# Patient Record
Sex: Female | Born: 1975 | Race: White | Hispanic: No | Marital: Married | Smoking: Current every day smoker
Health system: Southern US, Community
[De-identification: ages and names within clinical notes are randomized; demographics above are authoritative.]

## PROBLEM LIST (undated history)

## (undated) DIAGNOSIS — R7303 Prediabetes: Secondary | ICD-10-CM

## (undated) DIAGNOSIS — M1990 Unspecified osteoarthritis, unspecified site: Secondary | ICD-10-CM

## (undated) DIAGNOSIS — N6452 Nipple discharge: Secondary | ICD-10-CM

## (undated) DIAGNOSIS — G4730 Sleep apnea, unspecified: Secondary | ICD-10-CM

## (undated) HISTORY — PX: CYST REMOVAL NECK: SHX6281

## (undated) HISTORY — PX: OTHER SURGICAL HISTORY: SHX169

## (undated) HISTORY — PX: TUBAL LIGATION: SHX77

---

## 2001-04-08 ENCOUNTER — Ambulatory Visit (HOSPITAL_COMMUNITY): Admission: RE | Admit: 2001-04-08 | Discharge: 2001-04-08 | Payer: Self-pay | Admitting: Obstetrics and Gynecology

## 2001-04-11 ENCOUNTER — Ambulatory Visit (HOSPITAL_COMMUNITY): Admission: RE | Admit: 2001-04-11 | Discharge: 2001-04-11 | Payer: Self-pay | Admitting: Obstetrics and Gynecology

## 2001-04-11 ENCOUNTER — Encounter: Payer: Self-pay | Admitting: Obstetrics and Gynecology

## 2001-04-24 ENCOUNTER — Encounter: Payer: Self-pay | Admitting: Obstetrics and Gynecology

## 2001-04-24 ENCOUNTER — Ambulatory Visit (HOSPITAL_COMMUNITY): Admission: RE | Admit: 2001-04-24 | Discharge: 2001-04-24 | Payer: Self-pay | Admitting: Obstetrics and Gynecology

## 2012-03-12 ENCOUNTER — Other Ambulatory Visit (HOSPITAL_COMMUNITY)
Admission: RE | Admit: 2012-03-12 | Discharge: 2012-03-12 | Disposition: A | Discharge status: Home / Self Care | Payer: Self-pay | Source: Ambulatory Visit | Attending: Obstetrics and Gynecology | Admitting: Obstetrics and Gynecology

## 2012-03-12 DIAGNOSIS — Z01419 Encounter for gynecological examination (general) (routine) without abnormal findings: Secondary | ICD-10-CM | POA: Insufficient documentation

## 2012-10-25 DIAGNOSIS — R079 Chest pain, unspecified: Secondary | ICD-10-CM

## 2013-06-06 ENCOUNTER — Emergency Department (HOSPITAL_COMMUNITY)
Admission: EM | Admit: 2013-06-06 | Discharge: 2013-06-06 | Disposition: A | Discharge status: Home / Self Care | Payer: Self-pay | Attending: Emergency Medicine | Admitting: Emergency Medicine

## 2013-06-06 ENCOUNTER — Emergency Department (HOSPITAL_COMMUNITY): Payer: Self-pay

## 2013-06-06 ENCOUNTER — Encounter (HOSPITAL_COMMUNITY): Payer: Self-pay | Admitting: *Deleted

## 2013-06-06 DIAGNOSIS — M25561 Pain in right knee: Secondary | ICD-10-CM

## 2013-06-06 DIAGNOSIS — F17200 Nicotine dependence, unspecified, uncomplicated: Secondary | ICD-10-CM | POA: Insufficient documentation

## 2013-06-06 DIAGNOSIS — M25469 Effusion, unspecified knee: Secondary | ICD-10-CM | POA: Insufficient documentation

## 2013-06-06 DIAGNOSIS — M25569 Pain in unspecified knee: Secondary | ICD-10-CM | POA: Insufficient documentation

## 2013-06-06 DIAGNOSIS — Z880 Allergy status to penicillin: Secondary | ICD-10-CM | POA: Insufficient documentation

## 2013-06-06 MED ORDER — HYDROCODONE-ACETAMINOPHEN 5-325 MG PO TABS: 2.0000 | ORAL_TABLET | Freq: Four times a day (QID) | ORAL | Status: DC | PRN

## 2013-06-06 NOTE — ED Provider Notes (Signed)
History    CSN: [id number] Arrival date & time 06/06/13  1354  First MD Initiated Contact with Patient 06/06/13 1407     Chief Complaint  Patient presents with  . Knee Pain   (Consider location/radiation/quality/duration/timing/severity/associated sxs/prior Treatment) Patient is a 37 y.o. female presenting with knee pain.  Knee Pain  Pt reports about a week of progressively worsening R knee pain, moderate to severe aching pain, worse with movement and associated with a 'knot' behind knee. No known injury.   History reviewed. No pertinent past medical history. Past Surgical History  Procedure Laterality Date  . Cesarean section    . Cataract surgery Bilateral   . Cyst removal neck    . Tubal ligation     History reviewed. No pertinent family history. History  Substance Use Topics  . Smoking status: Current Every Day Smoker -- 1.00 packs/day    Types: Cigarettes  . Smokeless tobacco: Not on file  . Alcohol Use: No   OB History   Grav Para Term Preterm Abortions TAB SAB Ect Mult Living   3 3 3             Review of Systems All other systems reviewed and are negative except as noted in HPI.   Allergies  Penicillins  Home Medications   Current Outpatient Rx  Name  Route  Sig  Dispense  Refill  . ibuprofen (ADVIL,MOTRIN) 200 MG tablet   Oral   Take 200 mg by mouth every 6 (six) hours as needed for pain.          BP 132/64  Pulse 76  Temp(Src) 97.5 F (36.4 C) (Oral)  Resp 16  Ht 5\' 5"  (1.651 m)  Wt 295 lb (133.811 kg)  BMI 49.09 kg/m2  SpO2 100%  LMP 05/22/2013 Physical Exam  Nursing note and vitals reviewed. Constitutional: She is oriented to person, place, and time. She appears well-developed and well-nourished.  HENT:  Head: Normocephalic and atraumatic.  Eyes: EOM are normal. Pupils are equal, round, and reactive to light.  Neck: Normal range of motion. Neck supple.  Cardiovascular: Normal rate, normal heart sounds and intact distal pulses.    Pulmonary/Chest: Effort normal and breath sounds normal.  Abdominal: Bowel sounds are normal. She exhibits no distension. There is no tenderness.  Musculoskeletal: She exhibits edema (2+ bilateral LE edema is symmetric) and tenderness (tender to palpation R knee with a tender knot in popliteal fossa, decreased ROM due to pain).  Neurological: She is alert and oriented to person, place, and time. She has normal strength. No cranial nerve deficit or sensory deficit.  Skin: Skin is warm and dry. No rash noted.  Psychiatric: She has a normal mood and affect.    ED Course  Procedures (including critical care time) Labs Reviewed - No data to display US Venous Img Lower Unilateral Right  06/06/2013   *RADIOLOGY REPORT*  Clinical data:  Right lower extremity pain localizing to the the and popliteal fossa.  RIGHT LOWER EXTREMITY VENOUS DOPPLER ULTRASOUND  Technique:  Gray-scale sonography with compression, as well as color and duplex Doppler ultrasound, were performed to evaluate the deep venous system from the level of the common femoral vein through the popliteal and proximal calf veins. Evaluation also included physiologic response to augmentation.  Comparison: None.  Findings: All of the visualized deep veins in the right lower extremity demonstrate normal Doppler signal, normal compressibility, normal phasicity, and normal physiologic response to augmentation.  No gray scale filling defects were  identified. Note is made of mild subcutaneous edema overlying the right ankle joint.  IMPRESSION: No evidence of right lower extremity DVT.  No visible popliteal/Baker's cyst.   Original Report Authenticated By: Hulan Saas, M.D.   1. Knee pain, right     MDM  [place] neg for DVT or Baker's cyst. Atraumatic knee pain, no signs of infection or significant effusion. Suspect some degree of osteoarthritis, but no concern for acute fracture. Knee immobilizer, pain medications and Ortho referral.   Charles B.  Bernette Mayers, MD 06/06/13 1511

## 2013-06-06 NOTE — ED Notes (Signed)
R knee pain began a week ago which has progressively worsened.  Keeps awake at night d/t pain.  Can palpate knot behind right knee. Limits mobility.

## 2013-06-06 NOTE — ED Notes (Signed)
Right knee ace wrapped and patient given instructions.

## 2013-06-06 NOTE — ED Notes (Signed)
Unable to apply knee immobilizer due to patient size.

## 2013-06-06 NOTE — ED Notes (Addendum)
The patient states that she has been having posterior right knee pain x1 week, states that pain has gradually gotten worse since the beginning of the week.  The patient states that her right knee feels swollen to her.

## 2013-12-16 ENCOUNTER — Encounter (HOSPITAL_COMMUNITY): Payer: Self-pay | Admitting: Emergency Medicine

## 2013-12-16 ENCOUNTER — Emergency Department (HOSPITAL_COMMUNITY): Payer: No Typology Code available for payment source

## 2013-12-16 ENCOUNTER — Emergency Department (HOSPITAL_COMMUNITY)
Admission: EM | Admit: 2013-12-16 | Discharge: 2013-12-16 | Disposition: A | Discharge status: Home / Self Care | Payer: No Typology Code available for payment source | Attending: Emergency Medicine | Admitting: Emergency Medicine

## 2013-12-16 DIAGNOSIS — S8010XA Contusion of unspecified lower leg, initial encounter: Secondary | ICD-10-CM | POA: Insufficient documentation

## 2013-12-16 DIAGNOSIS — Z3202 Encounter for pregnancy test, result negative: Secondary | ICD-10-CM | POA: Insufficient documentation

## 2013-12-16 DIAGNOSIS — Z880 Allergy status to penicillin: Secondary | ICD-10-CM | POA: Insufficient documentation

## 2013-12-16 DIAGNOSIS — Y9389 Activity, other specified: Secondary | ICD-10-CM | POA: Insufficient documentation

## 2013-12-16 DIAGNOSIS — S199XXA Unspecified injury of neck, initial encounter: Secondary | ICD-10-CM

## 2013-12-16 DIAGNOSIS — S20219A Contusion of unspecified front wall of thorax, initial encounter: Secondary | ICD-10-CM | POA: Insufficient documentation

## 2013-12-16 DIAGNOSIS — S46911A Strain of unspecified muscle, fascia and tendon at shoulder and upper arm level, right arm, initial encounter: Secondary | ICD-10-CM

## 2013-12-16 DIAGNOSIS — Y92410 Unspecified street and highway as the place of occurrence of the external cause: Secondary | ICD-10-CM | POA: Insufficient documentation

## 2013-12-16 DIAGNOSIS — IMO0002 Reserved for concepts with insufficient information to code with codable children: Secondary | ICD-10-CM | POA: Insufficient documentation

## 2013-12-16 DIAGNOSIS — S8011XA Contusion of right lower leg, initial encounter: Secondary | ICD-10-CM

## 2013-12-16 DIAGNOSIS — S0993XA Unspecified injury of face, initial encounter: Secondary | ICD-10-CM | POA: Insufficient documentation

## 2013-12-16 DIAGNOSIS — F17200 Nicotine dependence, unspecified, uncomplicated: Secondary | ICD-10-CM | POA: Insufficient documentation

## 2013-12-16 LAB — PREGNANCY, URINE: Preg Test, Ur: NEGATIVE

## 2013-12-16 MED ORDER — OXYCODONE-ACETAMINOPHEN 5-325 MG PO TABS: 1.0000 | ORAL_TABLET | ORAL | Status: DC | PRN

## 2013-12-16 MED ORDER — OXYCODONE-ACETAMINOPHEN 5-325 MG PO TABS
2.0000 | ORAL_TABLET | Freq: Once | ORAL | Status: AC
  Administered 2013-12-16: 2 via ORAL
  Filled 2013-12-16: qty 2

## 2013-12-16 MED ORDER — OXYCODONE HCL 5 MG PO TABS
5.0000 mg | ORAL_TABLET | Freq: Once | ORAL | Status: AC
  Administered 2013-12-16: 5 mg via ORAL
  Filled 2013-12-16: qty 1

## 2013-12-16 MED ORDER — IBUPROFEN 400 MG PO TABS
600.0000 mg | ORAL_TABLET | Freq: Once | ORAL | Status: AC
  Administered 2013-12-16: 600 mg via ORAL
  Filled 2013-12-16: qty 2
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
## 2014-04-10 ENCOUNTER — Ambulatory Visit (HOSPITAL_COMMUNITY)
Admission: RE | Admit: 2014-04-10 | Discharge: 2014-04-10 | Disposition: A | Discharge status: Home / Self Care | Payer: BC Managed Care – PPO | Source: Ambulatory Visit | Attending: Dermatology | Admitting: Dermatology

## 2014-04-10 ENCOUNTER — Other Ambulatory Visit (HOSPITAL_COMMUNITY): Payer: Self-pay | Admitting: Internal Medicine

## 2014-04-10 ENCOUNTER — Other Ambulatory Visit (HOSPITAL_COMMUNITY): Payer: Self-pay | Admitting: Dermatology

## 2014-04-10 DIAGNOSIS — M795 Residual foreign body in soft tissue: Secondary | ICD-10-CM

## 2014-04-30 ENCOUNTER — Encounter (HOSPITAL_COMMUNITY)
Admission: RE | Admit: 2014-04-30 | Discharge: 2014-04-30 | Disposition: A | Discharge status: Home / Self Care | Payer: BC Managed Care – PPO | Source: Ambulatory Visit | Attending: General Surgery | Admitting: General Surgery

## 2014-04-30 ENCOUNTER — Encounter (HOSPITAL_COMMUNITY): Payer: Self-pay | Admitting: Pharmacy Technician

## 2014-04-30 ENCOUNTER — Encounter (HOSPITAL_COMMUNITY): Payer: Self-pay

## 2014-04-30 DIAGNOSIS — Z01812 Encounter for preprocedural laboratory examination: Secondary | ICD-10-CM | POA: Insufficient documentation

## 2014-04-30 DIAGNOSIS — Z01818 Encounter for other preprocedural examination: Secondary | ICD-10-CM | POA: Insufficient documentation

## 2014-04-30 LAB — CBC WITH DIFFERENTIAL/PLATELET
Basophils Absolute: 0.1 10*3/uL (ref 0.0–0.1)
Basophils Relative: 0 % (ref 0–1)
EOS ABS: 0.6 10*3/uL (ref 0.0–0.7)
Eosinophils Relative: 5 % (ref 0–5)
HCT: 39.6 % (ref 36.0–46.0)
Hemoglobin: 13.5 g/dL (ref 12.0–15.0)
LYMPHS ABS: 3.2 10*3/uL (ref 0.7–4.0)
Lymphocytes Relative: 26 % (ref 12–46)
MCH: 31.5 pg (ref 26.0–34.0)
MCHC: 34.1 g/dL (ref 30.0–36.0)
MCV: 92.5 fL (ref 78.0–100.0)
Monocytes Absolute: 0.7 10*3/uL (ref 0.1–1.0)
Monocytes Relative: 6 % (ref 3–12)
NEUTROS ABS: 7.5 10*3/uL (ref 1.7–7.7)
NEUTROS PCT: 63 % (ref 43–77)
PLATELETS: 310 10*3/uL (ref 150–400)
RBC: 4.28 MIL/uL (ref 3.87–5.11)
RDW: 13.4 % (ref 11.5–15.5)
WBC: 12 10*3/uL — AB (ref 4.0–10.5)

## 2014-04-30 LAB — BASIC METABOLIC PANEL
BUN: 10 mg/dL (ref 6–23)
CALCIUM: 8.9 mg/dL (ref 8.4–10.5)
CO2: 28 mEq/L (ref 19–32)
Chloride: 102 mEq/L (ref 96–112)
Creatinine, Ser: 0.73 mg/dL (ref 0.50–1.10)
GLUCOSE: 137 mg/dL — AB (ref 70–99)
POTASSIUM: 4.8 meq/L (ref 3.7–5.3)
Sodium: 141 mEq/L (ref 137–147)

## 2014-04-30 NOTE — Pre-Procedure Instructions (Signed)
Patient given information to sign up for my chart at home. 
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
## 2014-04-30 NOTE — Patient Instructions (Signed)
     LANISA ISHLER  04/30/2014   Your procedure is scheduled on:   05/05/5014  [facility] to [facility] at  64  AM.  Call this number if you have problems the morning of surgery: [phone]   Remember:   Do not eat food or drink liquids after midnight.   Take these medicines the morning of surgery with A SIP OF WATER:  oxycodone   Do not wear jewelry, make-up or nail polish.  Do not wear lotions, powders, or perfumes.   Do not shave 48 hours prior to surgery. Men may shave face and neck.  Do not bring valuables to the hospital.  [facility] is not responsible for any belongings or valuables.               Contacts, dentures or bridgework may not be worn into surgery.  Leave suitcase in the car. After surgery it may be brought to your room.  For patients admitted to the hospital, discharge time is determined by your treatment team.               Patients discharged the day of surgery will not be allowed to drive home.  Name and phone number of your driver: family  Special Instructions: Shower using CHG 2 nights before surgery and the night before surgery.  If you shower the day of surgery use CHG.  Use special wash - you have one bottle of CHG for all showers.  You should use approximately 1/3 of the bottle for each shower.   Please read over the following fact sheets that you were given: Pain Booklet, Coughing and Deep Breathing, Surgical Site Infection Prevention, Anesthesia Post-op Instructions and Care and Recovery After Surgery  Place foreign body patient instructions here. PATIENT INSTRUCTIONS POST-ANESTHESIA  IMMEDIATELY FOLLOWING SURGERY:  Do not drive or operate machinery for the first twenty four hours after surgery.  Do not make any important decisions for twenty four hours after surgery or while taking narcotic pain medications or sedatives.  If you develop intractable nausea and vomiting or a severe headache please notify your doctor immediately.  FOLLOW-UP:  Please  make an appointment with your surgeon as instructed. You do not need to follow up with anesthesia unless specifically instructed to do so.  WOUND CARE INSTRUCTIONS (if applicable):  Keep a dry clean dressing on the anesthesia/puncture wound site if there is drainage.  Once the wound has quit draining you may leave it open to air.  Generally you should leave the bandage intact for twenty four hours unless there is drainage.  If the epidural site drains for more than 36-48 hours please call the anesthesia department.  QUESTIONS?:  Please feel free to call your physician or the hospital operator if you have any questions, and they will be happy to assist you.

## 2014-05-05 ENCOUNTER — Ambulatory Visit (HOSPITAL_COMMUNITY): Payer: BC Managed Care – PPO | Admitting: Anesthesiology

## 2014-05-05 ENCOUNTER — Encounter (HOSPITAL_COMMUNITY): Payer: BC Managed Care – PPO | Admitting: Anesthesiology

## 2014-05-05 ENCOUNTER — Ambulatory Visit (HOSPITAL_COMMUNITY): Payer: BC Managed Care – PPO

## 2014-05-05 ENCOUNTER — Encounter (HOSPITAL_COMMUNITY)
Admission: RE | Disposition: A | Discharge status: Home / Self Care | Payer: Self-pay | Source: Ambulatory Visit | Attending: General Surgery

## 2014-05-05 ENCOUNTER — Encounter (HOSPITAL_COMMUNITY): Payer: Self-pay | Admitting: *Deleted

## 2014-05-05 ENCOUNTER — Ambulatory Visit (HOSPITAL_COMMUNITY)
Admission: RE | Admit: 2014-05-05 | Discharge: 2014-05-05 | Disposition: A | Discharge status: Home / Self Care | Payer: BC Managed Care – PPO | Source: Ambulatory Visit | Attending: General Surgery | Admitting: General Surgery

## 2014-05-05 DIAGNOSIS — S81009A Unspecified open wound, unspecified knee, initial encounter: Secondary | ICD-10-CM | POA: Insufficient documentation

## 2014-05-05 DIAGNOSIS — F17200 Nicotine dependence, unspecified, uncomplicated: Secondary | ICD-10-CM | POA: Insufficient documentation

## 2014-05-05 DIAGNOSIS — S81809A Unspecified open wound, unspecified lower leg, initial encounter: Principal | ICD-10-CM

## 2014-05-05 DIAGNOSIS — S91009A Unspecified open wound, unspecified ankle, initial encounter: Principal | ICD-10-CM

## 2014-05-05 DIAGNOSIS — Y929 Unspecified place or not applicable: Secondary | ICD-10-CM | POA: Insufficient documentation

## 2014-05-05 HISTORY — PX: FOREIGN BODY REMOVAL: SHX962

## 2014-05-05 SURGERY — FOREIGN BODY REMOVAL ADULT
Anesthesia: General | Site: Leg Lower | Laterality: Right

## 2014-05-05 MED ORDER — MIDAZOLAM HCL 2 MG/2ML IJ SOLN
INTRAMUSCULAR | Status: AC
  Filled 2014-05-05: qty 2

## 2014-05-05 MED ORDER — LACTATED RINGERS IV SOLN
INTRAVENOUS | Status: DC
  Administered 2014-05-05: 1000 mL via INTRAVENOUS

## 2014-05-05 MED ORDER — MIDAZOLAM HCL 2 MG/2ML IJ SOLN
1.0000 mg | INTRAMUSCULAR | Status: DC | PRN
  Administered 2014-05-05: 2 mg via INTRAVENOUS

## 2014-05-05 MED ORDER — FENTANYL CITRATE 0.05 MG/ML IJ SOLN
INTRAMUSCULAR | Status: DC | PRN
  Administered 2014-05-05: 25 ug via INTRAVENOUS
  Administered 2014-05-05: 50 ug via INTRAVENOUS
  Administered 2014-05-05: 25 ug via INTRAVENOUS
  Administered 2014-05-05 (×2): 50 ug via INTRAVENOUS

## 2014-05-05 MED ORDER — BUPIVACAINE HCL (PF) 0.5 % IJ SOLN
INTRAMUSCULAR | Status: AC
  Filled 2014-05-05: qty 30

## 2014-05-05 MED ORDER — PROPOFOL 10 MG/ML IV BOLUS
INTRAVENOUS | Status: DC | PRN
  Administered 2014-05-05: 175 mg via INTRAVENOUS

## 2014-05-05 MED ORDER — FENTANYL CITRATE 0.05 MG/ML IJ SOLN
INTRAMUSCULAR | Status: AC
  Filled 2014-05-05: qty 2

## 2014-05-05 MED ORDER — HYDROCODONE-ACETAMINOPHEN 5-325 MG PO TABS: 1.0000 | ORAL_TABLET | Freq: Four times a day (QID) | ORAL | Status: AC | PRN

## 2014-05-05 MED ORDER — ONDANSETRON HCL 4 MG/2ML IJ SOLN: 4.0000 mg | Freq: Once | INTRAMUSCULAR | Status: DC | PRN

## 2014-05-05 MED ORDER — POVIDONE-IODINE 10 % EX OINT
TOPICAL_OINTMENT | CUTANEOUS | Status: DC | PRN
  Administered 2014-05-05: 1 via TOPICAL

## 2014-05-05 MED ORDER — KETOROLAC TROMETHAMINE 30 MG/ML IJ SOLN
30.0000 mg | Freq: Once | INTRAMUSCULAR | Status: AC
  Administered 2014-05-05: 30 mg via INTRAVENOUS
  Filled 2014-05-05: qty 1

## 2014-05-05 MED ORDER — GLYCOPYRROLATE 0.2 MG/ML IJ SOLN
INTRAMUSCULAR | Status: AC
  Filled 2014-05-05: qty 1

## 2014-05-05 MED ORDER — LIDOCAINE HCL 1 % IJ SOLN
INTRAMUSCULAR | Status: DC | PRN
  Administered 2014-05-05: 40 mg via INTRADERMAL

## 2014-05-05 MED ORDER — FENTANYL CITRATE 0.05 MG/ML IJ SOLN
INTRAMUSCULAR | Status: AC
  Filled 2014-05-05: qty 5

## 2014-05-05 MED ORDER — ONDANSETRON HCL 4 MG/2ML IJ SOLN
INTRAMUSCULAR | Status: AC
  Filled 2014-05-05: qty 2

## 2014-05-05 MED ORDER — FENTANYL CITRATE 0.05 MG/ML IJ SOLN
25.0000 ug | INTRAMUSCULAR | Status: AC
  Administered 2014-05-05 (×2): 25 ug via INTRAVENOUS

## 2014-05-05 MED ORDER — GLYCOPYRROLATE 0.2 MG/ML IJ SOLN
0.2000 mg | Freq: Once | INTRAMUSCULAR | Status: AC
  Administered 2014-05-05: 0.2 mg via INTRAVENOUS

## 2014-05-05 MED ORDER — SULFAMETHOXAZOLE-TRIMETHOPRIM 400-80 MG PO TABS: 1.0000 | ORAL_TABLET | Freq: Two times a day (BID) | ORAL | Status: DC

## 2014-05-05 MED ORDER — 0.9 % SODIUM CHLORIDE (POUR BTL) OPTIME
TOPICAL | Status: DC | PRN
  Administered 2014-05-05: 1000 mL

## 2014-05-05 MED ORDER — PROPOFOL 10 MG/ML IV BOLUS
INTRAVENOUS | Status: AC
  Filled 2014-05-05: qty 20

## 2014-05-05 MED ORDER — CHLORHEXIDINE GLUCONATE 4 % EX LIQD: 1.0000 "application " | Freq: Once | CUTANEOUS | Status: DC

## 2014-05-05 MED ORDER — FENTANYL CITRATE 0.05 MG/ML IJ SOLN
25.0000 ug | INTRAMUSCULAR | Status: DC | PRN
  Administered 2014-05-05: 50 ug via INTRAVENOUS

## 2014-05-05 MED ORDER — VANCOMYCIN HCL 10 G IV SOLR
1500.0000 mg | INTRAVENOUS | Status: AC
  Administered 2014-05-05: 1500 mg via INTRAVENOUS
  Filled 2014-05-05: qty 1500

## 2014-05-05 MED ORDER — ONDANSETRON HCL 4 MG/2ML IJ SOLN
4.0000 mg | Freq: Once | INTRAMUSCULAR | Status: AC
  Administered 2014-05-05: 4 mg via INTRAVENOUS

## 2014-05-05 MED ORDER — POVIDONE-IODINE 10 % EX OINT
TOPICAL_OINTMENT | CUTANEOUS | Status: AC
  Filled 2014-05-05: qty 2

## 2014-05-05 SURGICAL SUPPLY — 31 items
BAG HAMPER (MISCELLANEOUS) ×2 IMPLANT
BANDAGE ELASTIC 6 VELCRO NS (GAUZE/BANDAGES/DRESSINGS) ×1 IMPLANT
BNDG COHESIVE 4X5 TAN STRL (GAUZE/BANDAGES/DRESSINGS) ×1 IMPLANT
CLOTH BEACON ORANGE TIMEOUT ST (SAFETY) ×2 IMPLANT
COVER LIGHT HANDLE STERIS (MISCELLANEOUS) ×4 IMPLANT
DRAPE C-ARM FOLDED MOBILE STRL (DRAPES) ×1 IMPLANT
ELECT NDL TIP 2.8 STRL (NEEDLE) ×1 IMPLANT
ELECT NEEDLE TIP 2.8 STRL (NEEDLE) ×2 IMPLANT
ELECT REM PT RETURN 9FT ADLT (ELECTROSURGICAL) ×2
ELECTRODE REM PT RTRN 9FT ADLT (ELECTROSURGICAL) ×1 IMPLANT
GLOVE BIOGEL PI IND STRL 7.0 (GLOVE) IMPLANT
GLOVE BIOGEL PI INDICATOR 7.0 (GLOVE) ×1
GLOVE EXAM NITRILE MD LF STRL (GLOVE) ×1 IMPLANT
GLOVE SS BIOGEL STRL SZ 6.5 (GLOVE) IMPLANT
GLOVE SUPERSENSE BIOGEL SZ 6.5 (GLOVE) ×1
GLOVE SURG SS PI 7.5 STRL IVOR (GLOVE) ×3 IMPLANT
GOWN STRL REUS W/TWL LRG LVL3 (GOWN DISPOSABLE) ×5 IMPLANT
KIT ROOM TURNOVER APOR (KITS) ×2 IMPLANT
MANIFOLD NEPTUNE II (INSTRUMENTS) ×2 IMPLANT
NS IRRIG 1000ML POUR BTL (IV SOLUTION) ×2 IMPLANT
PACK MINOR (CUSTOM PROCEDURE TRAY) ×2 IMPLANT
PAD ARMBOARD 7.5X6 YLW CONV (MISCELLANEOUS) ×2 IMPLANT
PADDING CAST ABS 6INX4YD NS (CAST SUPPLIES) ×1
PADDING CAST ABS COTTON 6X4 NS (CAST SUPPLIES) IMPLANT
SET BASIN LINEN APH (SET/KITS/TRAYS/PACK) ×2 IMPLANT
SOL PREP PROV IODINE SCRUB 4OZ (MISCELLANEOUS) ×2 IMPLANT
SPONGE GAUZE 4X4 12PLY (GAUZE/BANDAGES/DRESSINGS) ×1 IMPLANT
STOCKINETTE IMPERVIOUS LG (DRAPES) ×1 IMPLANT
STRIP CLOSURE SKIN 1/4X3 (GAUZE/BANDAGES/DRESSINGS) ×2 IMPLANT
SUT ETHILON 3 0 FSL (SUTURE) ×2 IMPLANT
SYR BULB IRRIGATION 50ML (SYRINGE) ×1 IMPLANT

## 2014-05-05 NOTE — Interval H&P Note (Signed)
History and Physical Interval Note:  05/05/2014 7:27 AM  Janice Gillespie  has presented today for surgery, with the diagnosis of foreign body right leg  The various methods of treatment have been discussed with the patient and family. After consideration of risks, benefits and other options for treatment, the patient has consented to  Procedure(s): FOREIGN BODY REMOVAL ADULT LEG (Right) as a surgical intervention .  The patient's history has been reviewed, patient examined, no change in status, stable for surgery.  I have reviewed the patient's chart and labs.  Questions were answered to the patient's satisfaction.     Aviva Signs A

## 2014-05-05 NOTE — Discharge Instructions (Signed)
Incision Care  An incision is a surgical cut to open your skin. You need to take care of your incision. This helps you to not get an infection. HOME CARE  Only take medicine as told by your doctor.  Do not take off your bandage (dressing) or get your incision wet until your doctor approves. Change the bandage and call your doctor if the bandage gets wet, dirty, or starts to smell.  Take showers. Do not take baths, swim, or do anything that may soak your incision until it heals.  Return to your normal diet and activities as told or allowed by your doctor.  Avoid lifting any weight until your doctor approves.  Put medicine that helps lessen itching on your incision as told by your doctor. Do not pick or scratch at your incision.  Keep your doctor visit to have your stitches (sutures) or staples removed.  Drink enough fluids to keep your pee (urine) clear or pale yellow. GET HELP RIGHT AWAY IF:  You have a fever.  You have a rash.  You are dizzy, or you pass out (faint) while standing.  You have trouble breathing.  You have a reaction or side effects to medicine given to you.  You have redness, puffiness (swelling), or more pain in the incision and medicine does not help.  You have fluid, blood, or yellowish-white fluid (pus) coming from the incision lasting over 1 day.  You have muscle aches, chills, or you feel sick.  You have a bad smell coming from the incision or bandage.  Your incision opens up after stitches, staples, or sticky strips have been removed.  You keep feeling sick to your stomach (nauseous) or keep throwing up (vomiting). MAKE SURE YOU:   Understand these instructions.  Will watch your condition.  Will get help right away if you are not doing well or get worse. Document Released: 01/30/2012 Document Reviewed: 01/30/2012 [facility] Patient Information 2014 [place].
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
## 2014-05-05 NOTE — Anesthesia Preprocedure Evaluation (Signed)
Anesthesia Evaluation  Patient identified by MRN, date of birth, ID band Patient awake    Reviewed: Allergy & Precautions, H&P , NPO status , Patient's Chart, lab work & pertinent test results  Airway Mallampati: II TM Distance: >3 FB Neck ROM: Full    Dental  (+) Teeth Intact   Pulmonary Current Smoker (am cough),  breath sounds clear to auscultation        Cardiovascular negative cardio ROS  Rhythm:Regular Rate:Normal     Neuro/Psych    GI/Hepatic negative GI ROS,   Endo/Other  Morbid obesity  Renal/GU      Musculoskeletal   Abdominal   Peds  Hematology   Anesthesia Other Findings   Reproductive/Obstetrics                           Anesthesia Physical Anesthesia Plan  ASA: II  Anesthesia Plan: General   Post-op Pain Management:    Induction: Intravenous  Airway Management Planned: LMA  Additional Equipment:   Intra-op Plan:   Post-operative Plan: Extubation in OR  Informed Consent: I have reviewed the patients History and Physical, chart, labs and discussed the procedure including the risks, benefits and alternatives for the proposed anesthesia with the patient or authorized representative who has indicated his/her understanding and acceptance.     Plan Discussed with:   Anesthesia Plan Comments:         Anesthesia Quick Evaluation

## 2014-05-05 NOTE — Transfer of Care (Signed)
Immediate Anesthesia Transfer of Care Note  Patient: Janice Gillespie  Procedure(s) Performed: Procedure(s): FOREIGN BODY REMOVAL ADULT LEG (Right)  Patient Location: PACU  Anesthesia Type:General  Level of Consciousness: awake  Airway & Oxygen Therapy: Patient Spontanous Breathing  Post-op Assessment: Report given to PACU RN  Post vital signs: Reviewed and stable  Complications: No apparent anesthesia complications

## 2014-05-05 NOTE — Anesthesia Procedure Notes (Addendum)
Procedure Name: LMA Insertion Date/Time: 05/05/2014 7:40 AM Performed by: Tressie Stalker E Pre-anesthesia Checklist: Patient identified, Patient being monitored, Emergency Drugs available, Timeout performed and Suction available Patient Re-evaluated:Patient Re-evaluated prior to inductionOxygen Delivery Method: Circle System Utilized Preoxygenation: Pre-oxygenation with 100% oxygen Intubation Type: IV induction Ventilation: Mask ventilation without difficulty LMA: LMA inserted LMA Size: 4.0 Number of attempts: 1 Placement Confirmation: positive ETCO2 and breath sounds checked- equal and bilateral

## 2014-05-05 NOTE — Anesthesia Postprocedure Evaluation (Signed)
  Anesthesia Post-op Note  Patient: Janice Gillespie  Procedure(s) Performed: Procedure(s): FOREIGN BODY REMOVAL ADULT LEG (Right)  Patient Location: PACU  Anesthesia Type:General  Level of Consciousness: awake, alert  and oriented  Airway and Oxygen Therapy: Patient Spontanous Breathing and Patient connected to face mask oxygen  Post-op Pain: mild  Post-op Assessment: Post-op Vital signs reviewed, Patient's Cardiovascular Status Stable, Respiratory Function Stable, Patent Airway and No signs of Nausea or vomiting  Post-op Vital Signs: Reviewed and stable  Last Vitals:  Filed Vitals:   05/05/14 0730  BP: 115/61  Pulse:   Temp:   Resp: 26    Complications: No apparent anesthesia complications

## 2014-05-06 ENCOUNTER — Encounter (HOSPITAL_COMMUNITY): Payer: Self-pay | Admitting: General Surgery

## 2014-09-22 ENCOUNTER — Encounter (HOSPITAL_COMMUNITY): Payer: Self-pay | Admitting: General Surgery

## 2014-10-09 ENCOUNTER — Other Ambulatory Visit: Payer: Self-pay | Admitting: Advanced Practice Midwife

## 2014-10-23 ENCOUNTER — Encounter: Payer: Self-pay | Admitting: Advanced Practice Midwife

## 2014-10-23 ENCOUNTER — Other Ambulatory Visit (HOSPITAL_COMMUNITY)
Admission: RE | Admit: 2014-10-23 | Discharge: 2014-10-23 | Disposition: A | Discharge status: Home / Self Care | Payer: 59 | Source: Ambulatory Visit | Attending: Advanced Practice Midwife | Admitting: Advanced Practice Midwife

## 2014-10-23 ENCOUNTER — Ambulatory Visit (INDEPENDENT_AMBULATORY_CARE_PROVIDER_SITE_OTHER): Payer: 59 | Admitting: Advanced Practice Midwife

## 2014-10-23 VITALS — BP 120/80 | Ht 66.0 in | Wt 310.0 lb

## 2014-10-23 DIAGNOSIS — Z1329 Encounter for screening for other suspected endocrine disorder: Secondary | ICD-10-CM

## 2014-10-23 DIAGNOSIS — Z1151 Encounter for screening for human papillomavirus (HPV): Secondary | ICD-10-CM | POA: Insufficient documentation

## 2014-10-23 DIAGNOSIS — Z6841 Body Mass Index (BMI) 40.0 and over, adult: Secondary | ICD-10-CM

## 2014-10-23 DIAGNOSIS — Z01419 Encounter for gynecological examination (general) (routine) without abnormal findings: Secondary | ICD-10-CM | POA: Insufficient documentation

## 2014-10-23 DIAGNOSIS — A5901 Trichomonal vulvovaginitis: Secondary | ICD-10-CM

## 2014-10-23 DIAGNOSIS — Z13220 Encounter for screening for lipoid disorders: Secondary | ICD-10-CM

## 2014-10-23 DIAGNOSIS — Z0189 Encounter for other specified special examinations: Secondary | ICD-10-CM

## 2014-10-23 LAB — CBC
HEMATOCRIT: 40 % (ref 36.0–46.0)
Hemoglobin: 13.8 g/dL (ref 12.0–15.0)
MCH: 30.9 pg (ref 26.0–34.0)
MCHC: 34.5 g/dL (ref 30.0–36.0)
MCV: 89.5 fL (ref 78.0–100.0)
MPV: 11.5 fL (ref 9.4–12.4)
Platelets: 315 10*3/uL (ref 150–400)
RBC: 4.47 MIL/uL (ref 3.87–5.11)
RDW: 13.5 % (ref 11.5–15.5)
WBC: 12.4 10*3/uL — AB (ref 4.0–10.5)

## 2014-10-23 LAB — COMPREHENSIVE METABOLIC PANEL
ALK PHOS: 96 U/L (ref 39–117)
ALT: 18 U/L (ref 0–35)
AST: 17 U/L (ref 0–37)
Albumin: 3.4 g/dL — ABNORMAL LOW (ref 3.5–5.2)
BUN: 14 mg/dL (ref 6–23)
CO2: 26 mEq/L (ref 19–32)
Calcium: 9.1 mg/dL (ref 8.4–10.5)
Chloride: 102 mEq/L (ref 96–112)
Creat: 0.66 mg/dL (ref 0.50–1.10)
GLUCOSE: 92 mg/dL (ref 70–99)
POTASSIUM: 4.5 meq/L (ref 3.5–5.3)
SODIUM: 138 meq/L (ref 135–145)
TOTAL PROTEIN: 6.5 g/dL (ref 6.0–8.3)
Total Bilirubin: 0.5 mg/dL (ref 0.2–1.2)

## 2014-10-23 LAB — LIPID PANEL
Cholesterol: 130 mg/dL (ref 0–200)
HDL: 39 mg/dL — ABNORMAL LOW
LDL Cholesterol: 72 mg/dL (ref 0–99)
Total CHOL/HDL Ratio: 3.3 ratio
Triglycerides: 96 mg/dL
VLDL: 19 mg/dL (ref 0–40)

## 2014-10-23 LAB — TSH: TSH: 1.057 u[IU]/mL (ref 0.350–4.500)

## 2014-10-23 NOTE — Progress Notes (Addendum)
Janice Gillespie 38 y.o.  Filed Vitals:   10/23/14 0913  BP: 120/80     Past Medical History: History reviewed. No pertinent past medical history.  Past Surgical History: Past Surgical History  Procedure Laterality Date  . Cesarean section    . Cataract surgery Bilateral   . Cyst removal neck    . Tubal ligation    . Foreign body removal Right 05/05/2014    Procedure: FOREIGN BODY REMOVAL ADULT LEG;  Surgeon: Jamesetta So, MD;  Location: [facility];  Service: General;  Laterality: Right;    Family History: Family History  Problem Relation Age of Onset  . Diabetes Father   . Heart disease Father   . Diabetes Maternal Grandmother   . Cancer Maternal Grandfather   . Mental illness Paternal Grandmother     dementia    Social History: History  Substance Use Topics  . Smoking status: Current Every Day Smoker -- 1.00 packs/day for 25 years    Types: Cigarettes  . Smokeless tobacco: Not on file  . Alcohol Use: No    Allergies:  Allergies  Allergen Reactions  . Doxycycline     Really bad hives  . Penicillins Hives     Current outpatient prescriptions: ibuprofen (ADVIL,MOTRIN) 200 MG tablet, Take 800 mg by mouth every 8 (eight) hours as needed for headache or moderate pain. , Disp: , Rfl: ;  HYDROcodone-acetaminophen (NORCO/VICODIN) 5-325 MG per tablet, Take 1-2 tablets by mouth every 6 (six) hours as needed for moderate pain. (Patient not taking: Reported on 10/23/2014), Disp: 40 tablet, Rfl: 0 sulfamethoxazole-trimethoprim (BACTRIM) 400-80 MG per tablet, Take 1 tablet by mouth 2 (two) times daily. (Patient not taking: Reported on 10/23/2014), Disp: 20 tablet, Rfl: 0  History of Present Illness: Here for Pap and physical.  Last pap 13 years ago (no insurance).  Had a BTL.  Denies health problems   Review of Systems   Patient denies any headaches, blurred vision, shortness of breath, chest pain, abdominal pain, problems with bowel movements, urination, or  intercourse.   Physical Exam: General:  Well developed, well nourished, no acute distress Skin:  Warm and dry Neck:  Midline trachea, normal thyroid Lungs; Clear to auscultation bilaterally Breast:  No dominant palpable mass, retraction, or nipple discharge Cardiovascular: Regular rate and rhythm Abdomen:  Soft, non tender, no hepatosplenomegaly Pelvic:  External genitalia is normal in appearance.  The vagina is normal in appearance.  The cervix is bulbous.  Uterus is felt to be normal size, shape, and contour. E  No adnexal masses or tenderness noted, but exam is limited by body habitus Extremities:  No swelling or varicosities noted.  Still has slow (nearly healed) wound from surgery in Janurary. Pt states she has been tested for DM, and the surgeon is aware that healing is slow.  Psych:  No mood changes.     Impression: normal GYN exam     Plan: pap/cotesting for HPV Lipids, CMP, CBC, and TSH screening labs  10/28/2014 Trichomonas on Pap. 2gm Flagyl for pt and partner sent to pharmacy.  POC 1 month

## 2014-10-24 LAB — CYTOLOGY - PAP

## 2014-10-28 DIAGNOSIS — A5901 Trichomonal vulvovaginitis: Secondary | ICD-10-CM | POA: Insufficient documentation

## 2014-10-28 MED ORDER — METRONIDAZOLE 500 MG PO TABS: 2000.0000 mg | ORAL_TABLET | Freq: Once | ORAL | Status: DC

## 2014-10-28 NOTE — Addendum Note (Signed)
Addended by: Christin Fudge on: 10/28/2014 09:21 AM   Modules accepted: Orders

## 2014-10-29 ENCOUNTER — Telehealth: Payer: Self-pay | Admitting: Advanced Practice Midwife

## 2014-10-30 ENCOUNTER — Telehealth: Payer: Self-pay | Admitting: Advanced Practice Midwife

## 2014-11-27 ENCOUNTER — Encounter: Payer: Self-pay | Admitting: Advanced Practice Midwife

## 2014-11-27 ENCOUNTER — Ambulatory Visit: Payer: 59 | Admitting: Advanced Practice Midwife

## 2017-03-08 DIAGNOSIS — L409 Psoriasis, unspecified: Secondary | ICD-10-CM | POA: Diagnosis not present

## 2017-03-08 DIAGNOSIS — M25512 Pain in left shoulder: Secondary | ICD-10-CM | POA: Diagnosis not present

## 2017-03-08 DIAGNOSIS — Z0000 Encounter for general adult medical examination without abnormal findings: Secondary | ICD-10-CM | POA: Diagnosis not present

## 2017-03-30 DIAGNOSIS — R7301 Impaired fasting glucose: Secondary | ICD-10-CM | POA: Diagnosis not present

## 2017-03-30 DIAGNOSIS — M25512 Pain in left shoulder: Secondary | ICD-10-CM | POA: Diagnosis not present

## 2017-03-30 DIAGNOSIS — D72829 Elevated white blood cell count, unspecified: Secondary | ICD-10-CM | POA: Diagnosis not present

## 2017-04-24 ENCOUNTER — Ambulatory Visit (INDEPENDENT_AMBULATORY_CARE_PROVIDER_SITE_OTHER): Payer: 59

## 2017-04-24 ENCOUNTER — Ambulatory Visit (INDEPENDENT_AMBULATORY_CARE_PROVIDER_SITE_OTHER): Payer: 59 | Admitting: Orthopedic Surgery

## 2017-04-24 ENCOUNTER — Encounter: Payer: Self-pay | Admitting: Orthopedic Surgery

## 2017-04-24 VITALS — BP 123/51 | HR 86 | Ht 65.5 in | Wt 311.0 lb

## 2017-04-24 DIAGNOSIS — M25561 Pain in right knee: Secondary | ICD-10-CM | POA: Diagnosis not present

## 2017-04-24 DIAGNOSIS — M23321 Other meniscus derangements, posterior horn of medial meniscus, right knee: Secondary | ICD-10-CM | POA: Diagnosis not present

## 2017-04-24 DIAGNOSIS — M1711 Unilateral primary osteoarthritis, right knee: Secondary | ICD-10-CM

## 2017-04-24 NOTE — Patient Instructions (Addendum)
Knee Arthroscopy Knee arthroscopy is a surgical procedure that is used to examine the inside of your knee joint and repair any damage. The surgeon puts a small, lighted instrument with a camera on the tip (arthroscope) through a small incision in your knee. The camera sends pictures to a monitor in the operating room. Your surgeon uses those pictures to guide the surgical instruments through other incisions to the area of damage. Knee arthroscopy can be used to treat many types of knee problems. It may be used:  To repair a torn ligament.  To repair or remove damaged tissue.  To remove a fluid-filled sac (cyst) from your knee.  Tell a health care provider about:  Any allergies you have.  All medicines you are taking, including vitamins, herbs, eye drops, creams, and over-the-counter medicines.  Any problems you or family members have had with anesthetic medicines.  Any blood disorders you have.  Any surgeries you have had.  Any medical conditions you have. What are the risks? Generally, this is a safe procedure. However, problems may occur, including:  Infection.  Bleeding.  Damage to blood vessels, nerves, or structures of your knee.  A blood clot that forms in your leg and travels to your lung.  Failure to relieve symptoms.  What happens before the procedure?  Ask your health care provider about: ? Changing or stopping your regular medicines. This is especially important if you are taking diabetes medicines or blood thinners. ? Taking medicines such as aspirin and ibuprofen. These medicines can thin your blood. Do not take these medicines before your procedure if your health care provider instructs you not to.  Follow your health care provider's instructions about eating or drinking restrictions.  Plan to have someone take you home after the procedure.  If you go home right after the procedure, plan to have someone with you for 24 hours.  Do not drink alcohol unless  your health care provider says that you can.  Do not use any tobacco products, including cigarettes, chewing tobacco, or electronic cigarettes unless your health care provider says that you can. If you need help quitting, ask your health care provider.  You may have a physical exam. What happens during the procedure?  An IV tube will be inserted into one of your veins.  You will be given one or more of the following: ? A medicine that helps you relax (sedative). ? A medicine that numbs the area (local anesthetic). ? A medicine that makes you fall asleep (general anesthetic). ? A medicine that is injected into your spine that numbs the area below and slightly above the injection site (spinal anesthetic). ? A medicine that is injected into an area of your body that numbs everything below the injection site (regional anesthetic).  A cuff may be placed around your upper leg to slow bleeding during the procedure.  The surgeon will make a small number of incisions around your knee.  Your knee joint will be flushed and filled with a germ-free (sterile) solution.  The arthroscope will be passed through an incision into your knee joint.  More instruments will be passed through other incisions to repair your knee as needed.  The fluid will be removed from your knee.  The incisions will be closed with adhesive strips or stitches (sutures).  A bandage (dressing) will be placed over your knee. The procedure may vary among health care providers and hospitals. What happens after the procedure?  Your blood pressure, heart rate, breathing  your body weight. You may have to wear compression stockings. These stocking help to prevent blood clots and reduce swelling in your legs. This information is not intended to replace advice given to you by your health care  provider. Make sure you discuss any questions you have with your health care provider. Document Released: 11/04/2000 Document Revised: 04/14/2016 Document Reviewed: 11/03/2014 Elsevier Interactive Patient Education  2017 [employer] Meniscus Tear A meniscus tear is a knee injury in which a piece of the meniscus is torn. The meniscus is a thick, rubbery, wedge-shaped cartilage in the knee. Two menisci are located in each knee. They sit between the upper bone (femur) and lower bone (tibia) that make up the knee joint. Each meniscus acts as a shock absorber for the knee. A torn meniscus is one of the most common types of knee injuries. This injury can range from mild to severe. Surgery may be needed for a severe tear. What are the causes? This injury may be caused by any squatting, twisting, or pivoting movement. Sports-related injuries are the most common cause. These often occur from:  Running and stopping suddenly.  Changing direction.  Being tackled or knocked off your feet.  As people get older, their meniscus gets thinner and weaker. In these people, tears can happen more easily, such as from climbing stairs. What increases the risk? This injury is more likely to happen to:  People who play contact sports.  Males.  People who are 30?43 years of age.  What are the signs or symptoms? Symptoms of this injury include:  Knee pain, especially at the side of the knee joint. You may feel pain when the injury occurs, or you may only hear a pop and feel pain later.  A feeling that your knee is clicking, catching, locking, or giving way.  Not being able to fully bend or extend your knee.  Bruising or swelling in your knee.  How is this diagnosed? This injury may be diagnosed based on your symptoms and a physical exam. The physical exam may include:  Moving your knee in different ways.  Feeling for tenderness.  Listening for a clicking sound.  Checking if your knee locks or  catches.  You may also have tests, such as:  X-rays.  MRI.  A procedure to look inside your knee with a narrow surgical telescope (arthroscopy).  You may be referred to a knee specialist (orthopedic surgeon). How is this treated? Treatment for this injury depends on the severity of the tear. Treatment for a mild tear may include:  Rest.  Medicine to reduce pain and swelling. This is usually a nonsteroidal anti-inflammatory drug (NSAID).  A knee brace or an elastic sleeve or wrap.  Using crutches or a walker to keep weight off your knee and to help you walk.  Exercises to strengthen your knee (physical therapy).  You may need surgery if you have a severe tear or if other treatments are not working. Follow these instructions at home: Managing pain and swelling  Take over-the-counter and prescription medicines only as told by your health care provider.  If directed, apply ice to the injured area: ? Put ice in a plastic bag. ? Place a towel between your skin and the bag. ? Leave the ice on for 20 minutes, 2-3 times per day.  Raise (elevate) the injured area above the level of your heart while you are sitting or lying down. Activity  Do not use the injured limb to support your body   Leave the ice on for 20 minutes, 2-3 times per day.  Raise (elevate) the injured area above the level of your heart while you are sitting or lying down. Activity  Do not use the injured limb to support your body weight until your health care provider says that you can. Use crutches or a walker as told by your health care provider.  Return to your normal activities as told by your health care provider. Ask your health care provider what activities are safe for you.  Perform range-of-motion exercises only as told by your health care provider.  Begin doing exercises to strengthen your knee and leg muscles only as told by your health care provider. After you recover, your health care provider may recommend these exercises to help prevent another injury. General instructions  Use a knee brace or elastic wrap as told by your health  care provider.  Keep all follow-up visits as told by your health care provider. This is important. Contact a health care provider if:  You have a fever.  Your knee becomes red, tender, or swollen.  Your pain medicine is not helping.  Your symptoms get worse or do not improve after 2 weeks of home care. This information is not intended to replace advice given to you by your health care provider. Make sure you discuss any questions you have with your health care provider. Document Released: 01/28/2003 Document Revised: 04/14/2016 Document Reviewed: 03/02/2015 Elsevier Interactive Patient Education  [employer].

## 2017-04-24 NOTE — Progress Notes (Signed)
Patient ID: Janice Gillespie, female   DOB: [birth year], 41 y.o.   MRN: [id number]  Chief Complaint  Patient presents with  . Knee Pain    Right knee pain, referred by Dr. Nevada Crane. No injury.    41 year old female presents with a 3 month history of new onset right knee pain with pain over the medial joint line and somewhat in the front of the knee and back of the knee with no history of trauma. She has been treated with diclofenac ice rest and Robaxin. She has not improved. She is having trouble walking in ending and straightening her right knee and it feels that this clinic about    Review of Systems  Constitutional: Negative for chills and fever.  Musculoskeletal: Negative for back pain.  Skin: Negative for rash.  Neurological: Negative for tingling.    The patient reports no medical history of hypertension diabetes or heart disease  Past Surgical History:  Procedure Laterality Date  . cataract surgery Bilateral   . CESAREAN SECTION    . CYST REMOVAL NECK    . FOREIGN BODY REMOVAL Right 05/05/2014   Procedure: FOREIGN BODY REMOVAL ADULT LEG;  Surgeon: Jamesetta So, MD;  Location: [facility];  Service: General;  Laterality: Right;  . TUBAL LIGATION      PHYSICAL EXAM  BP (!) 123/51   Pulse 86   Ht 5' 5.5" (1.664 m)   Wt (!) 311 lb (141.1 kg)   BMI 50.97 kg/m  GENERAL appearance reveals no gross abnormalities, normal development grooming and hygiene   MENTAL STATUS we note that the patient is awake alert and oriented to person place and time MOOD/AFFECT ARE NORMAL   GAIT reveals MODEST  limp in the effected limb  EXAM OF THE LEFT KNEE SKIN no erythema lacerations or ecchymosis  INSPECTION Moderate tenderness over the MEDIAL  joint line and MILD  joint effusion  ROM is 0-100 PASSIVE 115 PASSIVE  STABILITY tests for the acl (anterior drawer) and pcl posterior drawer are normal. the collateral ligaments are stable with varus valgus stress testing  MOTOR GRADE 5/5 in the  quad muscles  McMurray's test was positive for medial meniscal tear   examination of the  right knee   115 active and passive range of motion ligament stable  VASC 2+ dorsalis pedis pulse normal capillary refill excellent warmth to the extremity  NEURO normal sensation and no pathologic reflexes  LYMPH deferred noncontributory   IMAGING STUDIES  I have independently reviewed the x-rays and I interpreted the x-rays as follows:  Mild osteoarthritis right knee  Dx    Encounter Diagnoses  Name Primary?  . Acute pain of right knee   . Primary osteoarthritis of right knee   . Derangement of posterior horn of medial meniscus of right knee Yes   PLAN Continue ice NSAIDs MRI right knee evaluate for medial meniscus tear and possible surgery    9:43 AM Arther Abbott, MD 04/24/2017

## 2017-05-09 ENCOUNTER — Other Ambulatory Visit: Payer: Self-pay | Admitting: Orthopedic Surgery

## 2017-05-09 DIAGNOSIS — Z77018 Contact with and (suspected) exposure to other hazardous metals: Secondary | ICD-10-CM

## 2017-05-25 ENCOUNTER — Ambulatory Visit
Admission: RE | Admit: 2017-05-25 | Discharge: 2017-05-25 | Disposition: A | Discharge status: Home / Self Care | Payer: 59 | Source: Ambulatory Visit | Attending: Orthopedic Surgery | Admitting: Orthopedic Surgery

## 2017-05-25 DIAGNOSIS — S0990XA Unspecified injury of head, initial encounter: Secondary | ICD-10-CM | POA: Diagnosis not present

## 2017-05-25 DIAGNOSIS — M23321 Other meniscus derangements, posterior horn of medial meniscus, right knee: Secondary | ICD-10-CM

## 2017-05-25 DIAGNOSIS — Z77018 Contact with and (suspected) exposure to other hazardous metals: Secondary | ICD-10-CM

## 2017-05-31 ENCOUNTER — Ambulatory Visit: Payer: 59 | Admitting: Orthopedic Surgery

## 2017-06-12 ENCOUNTER — Encounter: Payer: Self-pay | Admitting: Orthopedic Surgery

## 2017-06-12 ENCOUNTER — Ambulatory Visit (INDEPENDENT_AMBULATORY_CARE_PROVIDER_SITE_OTHER): Payer: 59 | Admitting: Orthopedic Surgery

## 2017-06-12 DIAGNOSIS — M23321 Other meniscus derangements, posterior horn of medial meniscus, right knee: Secondary | ICD-10-CM | POA: Diagnosis not present

## 2017-06-12 NOTE — Patient Instructions (Signed)
Light duty 7/23 - aug 8  oow aug 9-sept 6  You have decided to proceed with operative arthroscopy of the knee. You have decided not to continue with nonoperative measures such as but not limited to oral medication, weight loss, activity modification, physical therapy, bracing, or injection.  We will perform operative arthroscopy of the knee. Some of the risks associated with arthroscopic surgery of the knee include but are not limited to Bleeding Infection Swelling Stiffness Blood clot Pain  If you're not comfortable with these risks and would like to continue with nonoperative treatment please let Dr. Aline Brochure know prior to your surgery.

## 2017-06-12 NOTE — Progress Notes (Signed)
Follow-up appointment  Chief Complaint  Patient presents with  . Follow-up    MRI review Rt knee    Chief complaint is right knee pain   Patient's MRI has been reviewed. She has a torn medial meniscus.  Discussed this with her and we both agreed that she should have surgery due to worsening symptoms.  Plan is to do a right knee arthroscopy partial medial meniscectomy  She will be light duty from today through August 8 and then on August 9 to be out of work through September 6   As noted in the my previous note: Knee Pain       Right knee pain, referred by Dr. Nevada Crane. No injury.      41 year old female presents with a 3 month history of new onset right knee pain with pain over the medial joint line and somewhat in the front of the knee and back of the knee with no history of trauma. She has been treated with diclofenac ice rest and Robaxin. She has not improved. She is having trouble walking in ending and straightening her right knee and it feels that this clinic about     Review of Systems  Constitutional: Negative for chills and fever.  Musculoskeletal: Negative for back pain.  Skin: Negative for rash.  Neurological: Negative for tingling.      The patient reports no medical history of hypertension diabetes or heart disease        Past Surgical History:  Procedure Laterality Date  . cataract surgery Bilateral    . CESAREAN SECTION      . CYST REMOVAL NECK      . FOREIGN BODY REMOVAL Right 05/05/2014    Procedure: FOREIGN BODY REMOVAL ADULT LEG;  Surgeon: Jamesetta So, MD;  Location: [facility];  Service: General;  Laterality: Right;  . TUBAL LIGATION          PHYSICAL EXAM  BP (!) 123/51   Pulse 86   Ht 5' 5.5" (1.664 m)   Wt (!) 311 lb (141.1 kg)   BMI 50.97 kg/m  GENERAL appearance reveals no gross abnormalities, normal development grooming and hygiene    MENTAL STATUS we note that the patient is awake alert and oriented to person place and time MOOD/AFFECT ARE  NORMAL    GAIT reveals MODEST  limp in the effected limb   EXAM OF THE LEFT KNEE SKIN no erythema lacerations or ecchymosis   INSPECTION Moderate tenderness over the MEDIAL  joint line and MILD  joint effusion  ROM is 0-100 PASSIVE 115 PASSIVE  STABILITY tests for the acl (anterior drawer) and pcl posterior drawer are normal. the collateral ligaments are stable with varus valgus stress testing  MOTOR GRADE 5/5 in the quad muscles  McMurray's test was positive for medial meniscal tear    examination of the  right knee   115 active and passive range of motion ligament stable   VASC 2+ dorsalis pedis pulse normal capillary refill excellent warmth to the extremity   NEURO normal sensation and no pathologic reflexes   LYMPH deferred noncontributory     IMAGING STUDIES  I have independently reviewed the x-rays and I interpreted the x-rays as follows:   Mild osteoarthritis right knee   Dx  MRI shows torn medial meniscus as noted above. Patient will have an arthroscopy of the right knee partial medial meniscectomy

## 2017-06-21 ENCOUNTER — Encounter: Payer: Self-pay | Admitting: *Deleted

## 2017-06-21 NOTE — Progress Notes (Signed)
REFERENCE [id number]

## 2017-06-22 NOTE — Patient Instructions (Signed)
Your procedure is scheduled on: 06/29/2017  Report to [facility] at    8:30 AM.  Call this number if you have problems the morning of surgery: [phone]   Remember:   Do not drink or eat food:After Midnight.  :     Do not wear jewelry, make-up or nail polish.  Do not wear lotions, powders, or perfumes. You may wear deodorant.  Do not shave 48 hours prior to surgery. Men may shave face and neck.  Do not bring valuables to the hospital.  Contacts, dentures or bridgework may not be worn into surgery.  Leave suitcase in the car. After surgery it may be brought to your room.  For patients admitted to the hospital, checkout time is 11:00 AM the day of discharge.   Patients discharged the day of surgery will not be allowed to drive home.    Special Instructions: Shower using CHG night before surgery and shower the day of surgery use CHG.  Use special wash - you have one bottle of CHG for all showers.  You should use approximately 1/2 of the bottle for each shower. Knee Arthroscopy Knee arthroscopy is a surgical procedure that is used to examine the inside of your knee joint and repair any damage. The surgeon puts a small, lighted instrument with a camera on the tip (arthroscope) through a small incision in your knee. The camera sends pictures to a monitor in the operating room. Your surgeon uses those pictures to guide the surgical instruments through other incisions to the area of damage. Knee arthroscopy can be used to treat many types of knee problems. It may be used:  To repair a torn ligament.  To repair or remove damaged tissue.  To remove a fluid-filled sac (cyst) from your knee.  Tell a health care provider about:  Any allergies you have.  All medicines you are taking, including vitamins, herbs, eye drops, creams, and over-the-counter medicines.  Any problems you or family members have had with anesthetic medicines.  Any blood disorders you have.  Any surgeries you have  had.  Any medical conditions you have. What are the risks? Generally, this is a safe procedure. However, problems may occur, including:  Infection.  Bleeding.  Damage to blood vessels, nerves, or structures of your knee.  A blood clot that forms in your leg and travels to your lung.  Failure to relieve symptoms.  What happens before the procedure?  Ask your health care provider about: ? Changing or stopping your regular medicines. This is especially important if you are taking diabetes medicines or blood thinners. ? Taking medicines such as aspirin and ibuprofen. These medicines can thin your blood. Do not take these medicines before your procedure if your health care provider instructs you not to.  Follow your health care provider's instructions about eating or drinking restrictions.  Plan to have someone take you home after the procedure.  If you go home right after the procedure, plan to have someone with you for 24 hours.  Do not drink alcohol unless your health care provider says that you can.  Do not use any tobacco products, including cigarettes, chewing tobacco, or electronic cigarettes unless your health care provider says that you can. If you need help quitting, ask your health care provider.  You may have a physical exam. What happens during the procedure?  An IV tube will be inserted into one of your veins.  You will be given one or more of the following: ?  A medicine that helps you relax (sedative). ? A medicine that numbs the area (local anesthetic). ? A medicine that makes you fall asleep (general anesthetic). ? A medicine that is injected into your spine that numbs the area below and slightly above the injection site (spinal anesthetic). ? A medicine that is injected into an area of your body that numbs everything below the injection site (regional anesthetic).  A cuff may be placed around your upper leg to slow bleeding during the procedure.  The surgeon  will make a small number of incisions around your knee.  Your knee joint will be flushed and filled with a germ-free (sterile) solution.  The arthroscope will be passed through an incision into your knee joint.  More instruments will be passed through other incisions to repair your knee as needed.  The fluid will be removed from your knee.  The incisions will be closed with adhesive strips or stitches (sutures).  A bandage (dressing) will be placed over your knee. The procedure may vary among health care providers and hospitals. What happens after the procedure?  Your blood pressure, heart rate, breathing rate and blood oxygen level will be monitored often until the medicines you were given have worn off.  You may be given medicine for pain.  You may get crutches to help you walk without using your knee to support your body weight.  You may have to wear compression stockings. These stocking help to prevent blood clots and reduce swelling in your legs. This information is not intended to replace advice given to you by your health care provider. Make sure you discuss any questions you have with your health care provider. Document Released: 11/04/2000 Document Revised: 04/14/2016 Document Reviewed: 11/03/2014 Elsevier Interactive Patient Education  2017 [place]. Knee Arthroscopy, Care After Refer to this sheet in the next few weeks. These instructions provide you with information about caring for yourself after your procedure. Your health care provider may also give you more specific instructions. Your treatment has been planned according to current medical practices, but problems sometimes occur. Call your health care provider if you have any problems or questions after your procedure. What can I expect after the procedure? After the procedure, it is common to have:  Soreness.  Pain.  Follow these instructions at home: Bathing  Do not take baths, swim, or use a hot tub until  your health care provider approves. Incision care  There are many different ways to close and cover an incision, including stitches, skin glue, and adhesive strips. Follow your health care provider's instructions about: ? Incision care. ? Bandage (dressing) changes and removal. ? Incision closure removal.  Check your incision area every day for signs of infection. Watch for: ? Redness, swelling, or pain. ? Fluid, blood, or pus. Activity  Avoid strenuous activities for as long as directed by your health care provider.  Return to your normal activities as directed by your health care provider. Ask your health care provider what activities are safe for you.  Perform range-of-motion exercises only as directed by your health care provider.  Do not lift anything that is heavier than 10 lb (4.5 kg).  Do not drive or operate heavy machinery while taking pain medicine.  If you were given crutches, use them as directed by your health care provider. Managing pain, stiffness, and swelling  If directed, apply ice to the injured area: ? Put ice in a plastic bag. ? Place a towel between your skin and the  bag. ? Leave the ice on for 20 minutes, 2-3 times per day.  Raise the injured area above the level of your heart while you are sitting or lying down as directed by your health care provider. General instructions  Keep all follow-up visits as directed by your health care provider. This is important.  Take medicines only as directed by your health care provider.  Do not use any tobacco products, including cigarettes, chewing tobacco, or electronic cigarettes. If you need help quitting, ask your health care provider.  If you were given compression stockings, wear them as directed by your health care provider. These stockings help prevent blood clots and reduce swelling in your legs. Contact a health care provider if:  You have severe pain with any movement of your knee.  You notice a bad  smell coming from the incision or dressing.  You have redness, swelling, or pain at the site of your incision.  You have fluid, blood, or pus coming from your incision. Get help right away if:  You develop a rash.  You have a fever.  You have difficulty breathing or have shortness of breath.  You develop pain in your calves or in the back of your knee.  You develop chest pain.  You develop numbness or tingling in your leg or foot. This information is not intended to replace advice given to you by your health care provider. Make sure you discuss any questions you have with your health care provider. Document Released: 05/27/2005 Document Revised: 04/08/2016 Document Reviewed: 11/03/2014 Elsevier Interactive Patient Education  2017 [place] Anesthesia, Adult, Care After These instructions provide you with information about caring for yourself after your procedure. Your health care provider may also give you more specific instructions. Your treatment has been planned according to current medical practices, but problems sometimes occur. Call your health care provider if you have any problems or questions after your procedure. What can I expect after the procedure? After the procedure, it is common to have:  Vomiting.  A sore throat.  Mental slowness.  It is common to feel:  Nauseous.  Cold or shivery.  Sleepy.  Tired.  Sore or achy, even in parts of your body where you did not have surgery.  Follow these instructions at home: For at least 24 hours after the procedure:  Do not: ? Participate in activities where you could fall or become injured. ? Drive. ? Use heavy machinery. ? Drink alcohol. ? Take sleeping pills or medicines that cause drowsiness. ? Make important decisions or sign legal documents. ? Take care of children on your own.  Rest. Eating and drinking  If you vomit, drink water, juice, or soup when you can drink without  vomiting.  Drink enough fluid to keep your urine clear or pale yellow.  Make sure you have little or no nausea before eating solid foods.  Follow the diet recommended by your health care provider. General instructions  Have a responsible adult stay with you until you are awake and alert.  Return to your normal activities as told by your health care provider. Ask your health care provider what activities are safe for you.  Take over-the-counter and prescription medicines only as told by your health care provider.  If you smoke, do not smoke without supervision.  Keep all follow-up visits as told by your health care provider. This is important. Contact a health care provider if:  You continue to have nausea or vomiting at home, and  medicines are not helpful.  You cannot drink fluids or start eating again.  You cannot urinate after 8-12 hours.  You develop a skin rash.  You have fever.  You have increasing redness at the site of your procedure. Get help right away if:  You have difficulty breathing.  You have chest pain.  You have unexpected bleeding.  You feel that you are having a life-threatening or urgent problem. This information is not intended to replace advice given to you by your health care provider. Make sure you discuss any questions you have with your health care provider. Document Released: 02/13/2001 Document Revised: 04/11/2016 Document Reviewed: 10/22/2015 Elsevier Interactive Patient Education  [employer].

## 2017-06-26 ENCOUNTER — Encounter (HOSPITAL_COMMUNITY): Payer: Self-pay

## 2017-06-26 ENCOUNTER — Encounter (HOSPITAL_COMMUNITY)
Admission: RE | Admit: 2017-06-26 | Discharge: 2017-06-26 | Disposition: A | Discharge status: Home / Self Care | Payer: 59 | Source: Ambulatory Visit | Attending: Orthopedic Surgery | Admitting: Orthopedic Surgery

## 2017-06-26 DIAGNOSIS — Z8249 Family history of ischemic heart disease and other diseases of the circulatory system: Secondary | ICD-10-CM | POA: Diagnosis not present

## 2017-06-26 DIAGNOSIS — Z818 Family history of other mental and behavioral disorders: Secondary | ICD-10-CM | POA: Diagnosis not present

## 2017-06-26 DIAGNOSIS — M23221 Derangement of posterior horn of medial meniscus due to old tear or injury, right knee: Secondary | ICD-10-CM | POA: Diagnosis not present

## 2017-06-26 DIAGNOSIS — Z01812 Encounter for preprocedural laboratory examination: Secondary | ICD-10-CM

## 2017-06-26 DIAGNOSIS — Z833 Family history of diabetes mellitus: Secondary | ICD-10-CM | POA: Diagnosis not present

## 2017-06-26 DIAGNOSIS — S83241A Other tear of medial meniscus, current injury, right knee, initial encounter: Secondary | ICD-10-CM | POA: Insufficient documentation

## 2017-06-26 DIAGNOSIS — X58XXXA Exposure to other specified factors, initial encounter: Secondary | ICD-10-CM

## 2017-06-26 DIAGNOSIS — F17210 Nicotine dependence, cigarettes, uncomplicated: Secondary | ICD-10-CM | POA: Diagnosis not present

## 2017-06-26 DIAGNOSIS — M660 Rupture of popliteal cyst: Secondary | ICD-10-CM | POA: Diagnosis not present

## 2017-06-26 DIAGNOSIS — M94261 Chondromalacia, right knee: Secondary | ICD-10-CM | POA: Diagnosis not present

## 2017-06-26 DIAGNOSIS — Z6841 Body Mass Index (BMI) 40.0 and over, adult: Secondary | ICD-10-CM | POA: Diagnosis not present

## 2017-06-26 DIAGNOSIS — M1711 Unilateral primary osteoarthritis, right knee: Secondary | ICD-10-CM | POA: Diagnosis not present

## 2017-06-26 LAB — BASIC METABOLIC PANEL
Anion gap: 8 (ref 5–15)
BUN: 14 mg/dL (ref 6–20)
CHLORIDE: 104 mmol/L (ref 101–111)
CO2: 26 mmol/L (ref 22–32)
CREATININE: 0.7 mg/dL (ref 0.44–1.00)
Calcium: 8.9 mg/dL (ref 8.9–10.3)
GFR calc non Af Amer: >60 mL/min (ref >60)
GLUCOSE: 166 mg/dL — AB (ref 65–99)
Potassium: 4.3 mmol/L (ref 3.5–5.1)
Sodium: 138 mmol/L (ref 135–145)

## 2017-06-26 LAB — CBC WITH DIFFERENTIAL/PLATELET
Basophils Absolute: 0.1 10*3/uL (ref 0.0–0.1)
Basophils Relative: 1 %
Eosinophils Absolute: 0.6 10*3/uL (ref 0.0–0.7)
Eosinophils Relative: 5 %
HEMATOCRIT: 40.4 % (ref 36.0–46.0)
HEMOGLOBIN: 13.7 g/dL (ref 12.0–15.0)
LYMPHS ABS: 3.4 10*3/uL (ref 0.7–4.0)
LYMPHS PCT: 32 %
MCH: 31.3 pg (ref 26.0–34.0)
MCHC: 33.9 g/dL (ref 30.0–36.0)
MCV: 92.2 fL (ref 78.0–100.0)
MONOS PCT: 5 %
Monocytes Absolute: 0.6 10*3/uL (ref 0.1–1.0)
NEUTROS ABS: 6.2 10*3/uL (ref 1.7–7.7)
NEUTROS PCT: 57 %
Platelets: 301 10*3/uL (ref 150–400)
RBC: 4.38 MIL/uL (ref 3.87–5.11)
RDW: 13.1 % (ref 11.5–15.5)
WBC: 10.9 10*3/uL — ABNORMAL HIGH (ref 4.0–10.5)

## 2017-06-26 LAB — SURGICAL PCR SCREEN
MRSA, PCR: NEGATIVE
Staphylococcus aureus: NEGATIVE

## 2017-06-26 LAB — HCG, SERUM, QUALITATIVE: Preg, Serum: NEGATIVE

## 2017-06-27 NOTE — Progress Notes (Addendum)
Patient ID: LUV MISH, female   DOB: 1976/08/11, 41 y.o.   MRN: [id number] 06/27/17 Surgery approved AUTHR # [id number] received per Victorino Sparrow. With [facility] for surgery scheduled for 06/29/17 CPT 29881

## 2017-06-28 NOTE — H&P (Addendum)
Outpatient preoperative history and physical for outpatient surgery  Chief complaint Knee Pain       Right knee pain, referred by Dr. Nevada Crane. No injury.      41 year old female presented with a 3 month history of new onset right knee pain with pain over the medial joint line and somewhat in the front of the knee and back of the knee with no history of trauma. She has been treated with diclofenac ice rest and Robaxin. She has not improved. She is having trouble walking as well as a feeling that the knee is going to give out     Review of Systems  Constitutional: Negative for chills and fever.  Musculoskeletal: Negative for back pain.  Skin: Negative for rash.  Neurological: Negative for tingling.  All other systems were reviewed as well and were negative   The patient reports no medical history of hypertension diabetes or heart disease        Past Surgical History:  Procedure Laterality Date  . cataract surgery Bilateral    . CESAREAN SECTION      . CYST REMOVAL NECK      . FOREIGN BODY REMOVAL Right 05/05/2014    Procedure: FOREIGN BODY REMOVAL ADULT LEG;  Surgeon: Jamesetta So, MD;  Location: [facility];  Service: General;  Laterality: Right;  . TUBAL LIGATION        Social History  Substance Use Topics  . Smoking status: Current Every Day Smoker    Packs/day: 1.00    Years: 25.00    Types: Cigarettes  . Smokeless tobacco: Never Used  . Alcohol use No   Family History  Problem Relation Age of Onset  . Diabetes Father   . Heart disease Father   . Diabetes Maternal Grandmother   . Cancer Maternal Grandfather   . Mental illness Paternal Grandmother        dementia   No current facility-administered medications for this encounter.   Current Outpatient Prescriptions:  .  acetaminophen (TYLENOL) 500 MG tablet, Take 500 mg by mouth every 6 (six) hours as needed for mild pain., Disp: , Rfl:  .  ibuprofen (ADVIL,MOTRIN) 200 MG tablet, Take 600 mg by mouth every 8 (eight) hours as  needed for headache. , Disp: , Rfl:   PHYSICAL EXAM  BP (!) 123/51   Pulse 86   Ht 5' 5.5" (1.664 m)   Wt (!) 311 lb (141.1 kg)   BMI 50.97 kg/m  GENERAL appearance reveals no gross abnormalities, normal development grooming and hygiene    MENTAL STATUS we note that the patient is awake alert and oriented to person place and time MOOD/AFFECT ARE NORMAL    GAIT reveals MODEST  limp in the effected limb   EXAM OF THE LEFT KNEE SKIN no erythema lacerations or ecchymosis   INSPECTION Moderate tenderness over the MEDIAL  joint line and MILD  joint effusion  ROM is 0-100 PASSIVE 115 PASSIVE  STABILITY tests for the acl (anterior drawer) and pcl posterior drawer are normal. the collateral ligaments are stable with varus valgus stress testing  MOTOR GRADE 5/5 in the quad muscles  McMurray's test was positive for medial meniscal tear    examination of the  right knee   115 active and passive range of motion ligament stable   VASC 2+ dorsalis pedis pulse normal capillary refill excellent warmth to the extremity   NEURO normal sensation and no pathologic reflexes   LYMPH deferred noncontributory  IMAGING STUDIES  I have independently reviewed the x-rays and I interpreted the x-rays as follows:   Mild osteoarthritis right knee  She also had an MRI which showed a torn medial meniscus. The report was read as IMPRESSION: Radial tear root of the posterior horn of the medial meniscus.   Ruptured Baker's cyst with fluid dissecting along the medial gastrocnemius.   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     Electronically Signed   By: Inge Rise M.D.   On: 05/25/2017 12:40    Dx  Torn medial meniscus of the right knee  The patient's options were nonoperative or operative treatment. After discussion of the risks and benefits of both nonoperative and operative treatment she has opted for operative treatment with an understanding of the appropriate risks and  benefits of surgery  Plan arthroscopy partial medial meniscectomy right knee

## 2017-06-29 ENCOUNTER — Ambulatory Visit (HOSPITAL_COMMUNITY)
Admission: RE | Admit: 2017-06-29 | Discharge: 2017-06-29 | Disposition: A | Discharge status: Home / Self Care | Payer: 59 | Source: Ambulatory Visit | Attending: Orthopedic Surgery | Admitting: Orthopedic Surgery

## 2017-06-29 ENCOUNTER — Ambulatory Visit (HOSPITAL_COMMUNITY): Payer: 59 | Admitting: Anesthesiology

## 2017-06-29 ENCOUNTER — Encounter (HOSPITAL_COMMUNITY)
Admission: RE | Disposition: A | Discharge status: Home / Self Care | Payer: Self-pay | Source: Ambulatory Visit | Attending: Orthopedic Surgery

## 2017-06-29 ENCOUNTER — Encounter (HOSPITAL_COMMUNITY): Payer: Self-pay

## 2017-06-29 DIAGNOSIS — Z833 Family history of diabetes mellitus: Secondary | ICD-10-CM | POA: Insufficient documentation

## 2017-06-29 DIAGNOSIS — M660 Rupture of popliteal cyst: Secondary | ICD-10-CM | POA: Diagnosis not present

## 2017-06-29 DIAGNOSIS — Z818 Family history of other mental and behavioral disorders: Secondary | ICD-10-CM | POA: Insufficient documentation

## 2017-06-29 DIAGNOSIS — M23221 Derangement of posterior horn of medial meniscus due to old tear or injury, right knee: Secondary | ICD-10-CM | POA: Insufficient documentation

## 2017-06-29 DIAGNOSIS — M23321 Other meniscus derangements, posterior horn of medial meniscus, right knee: Secondary | ICD-10-CM | POA: Diagnosis not present

## 2017-06-29 DIAGNOSIS — S83241A Other tear of medial meniscus, current injury, right knee, initial encounter: Secondary | ICD-10-CM | POA: Diagnosis not present

## 2017-06-29 DIAGNOSIS — M1711 Unilateral primary osteoarthritis, right knee: Secondary | ICD-10-CM | POA: Insufficient documentation

## 2017-06-29 DIAGNOSIS — Z9889 Other specified postprocedural states: Secondary | ICD-10-CM

## 2017-06-29 DIAGNOSIS — F17210 Nicotine dependence, cigarettes, uncomplicated: Secondary | ICD-10-CM | POA: Insufficient documentation

## 2017-06-29 DIAGNOSIS — M94261 Chondromalacia, right knee: Secondary | ICD-10-CM | POA: Diagnosis not present

## 2017-06-29 DIAGNOSIS — Z8249 Family history of ischemic heart disease and other diseases of the circulatory system: Secondary | ICD-10-CM | POA: Insufficient documentation

## 2017-06-29 DIAGNOSIS — Z6841 Body Mass Index (BMI) 40.0 and over, adult: Secondary | ICD-10-CM | POA: Insufficient documentation

## 2017-06-29 HISTORY — PX: KNEE ARTHROSCOPY WITH MEDIAL MENISECTOMY: SHX5651

## 2017-06-29 SURGERY — ARTHROSCOPY, KNEE, WITH MEDIAL MENISCECTOMY
Anesthesia: General | Laterality: Right

## 2017-06-29 MED ORDER — DEXAMETHASONE SODIUM PHOSPHATE 4 MG/ML IJ SOLN
INTRAMUSCULAR | Status: AC
  Filled 2017-06-29: qty 3

## 2017-06-29 MED ORDER — FENTANYL CITRATE (PF) 100 MCG/2ML IJ SOLN
INTRAMUSCULAR | Status: AC
  Filled 2017-06-29: qty 2

## 2017-06-29 MED ORDER — SODIUM CHLORIDE 0.9 % IR SOLN
Status: DC | PRN
  Administered 2017-06-29 (×3): 3000 mL

## 2017-06-29 MED ORDER — HYDROCODONE-ACETAMINOPHEN 7.5-325 MG PO TABS
1.0000 | ORAL_TABLET | Freq: Once | ORAL | Status: AC
  Administered 2017-06-29: 1 via ORAL

## 2017-06-29 MED ORDER — VANCOMYCIN HCL IN DEXTROSE 1-5 GM/200ML-% IV SOLN
INTRAVENOUS | Status: AC
  Filled 2017-06-29: qty 200

## 2017-06-29 MED ORDER — PROPOFOL 10 MG/ML IV BOLUS
INTRAVENOUS | Status: AC
  Filled 2017-06-29: qty 20

## 2017-06-29 MED ORDER — DEXAMETHASONE SODIUM PHOSPHATE 4 MG/ML IJ SOLN
INTRAMUSCULAR | Status: DC | PRN
  Administered 2017-06-29: 4 mg via INTRAVENOUS

## 2017-06-29 MED ORDER — ONDANSETRON HCL 4 MG/2ML IJ SOLN
INTRAMUSCULAR | Status: AC
  Filled 2017-06-29: qty 2

## 2017-06-29 MED ORDER — MIDAZOLAM HCL 2 MG/2ML IJ SOLN
INTRAMUSCULAR | Status: AC
  Filled 2017-06-29: qty 2

## 2017-06-29 MED ORDER — BUPIVACAINE-EPINEPHRINE (PF) 0.5% -1:200000 IJ SOLN
INTRAMUSCULAR | Status: DC | PRN
  Administered 2017-06-29 (×2): 30 mL via PERINEURAL

## 2017-06-29 MED ORDER — FENTANYL CITRATE (PF) 100 MCG/2ML IJ SOLN
INTRAMUSCULAR | Status: DC | PRN
  Administered 2017-06-29 (×4): 50 ug via INTRAVENOUS

## 2017-06-29 MED ORDER — ONDANSETRON HCL 4 MG/2ML IJ SOLN
4.0000 mg | Freq: Once | INTRAMUSCULAR | Status: AC
  Administered 2017-06-29: 4 mg via INTRAVENOUS

## 2017-06-29 MED ORDER — HYDROCODONE-ACETAMINOPHEN 7.5-325 MG PO TABS
ORAL_TABLET | ORAL | Status: AC
  Filled 2017-06-29: qty 1

## 2017-06-29 MED ORDER — LIDOCAINE HCL (CARDIAC) 20 MG/ML IV SOLN
INTRAVENOUS | Status: DC | PRN
  Administered 2017-06-29: 50 mg via INTRATRACHEAL

## 2017-06-29 MED ORDER — KETOROLAC TROMETHAMINE 30 MG/ML IJ SOLN
30.0000 mg | Freq: Once | INTRAMUSCULAR | Status: AC
  Administered 2017-06-29: 30 mg via INTRAVENOUS

## 2017-06-29 MED ORDER — MIDAZOLAM HCL 2 MG/2ML IJ SOLN
1.0000 mg | INTRAMUSCULAR | Status: AC
  Administered 2017-06-29: 2 mg via INTRAVENOUS

## 2017-06-29 MED ORDER — ALBUTEROL SULFATE HFA 108 (90 BASE) MCG/ACT IN AERS
INHALATION_SPRAY | RESPIRATORY_TRACT | Status: DC | PRN
  Administered 2017-06-29: 2 via RESPIRATORY_TRACT
  Administered 2017-06-29: 4 via RESPIRATORY_TRACT

## 2017-06-29 MED ORDER — SODIUM CHLORIDE 0.9 % IV SOLN
1500.0000 mg | INTRAVENOUS | Status: AC
  Administered 2017-06-29: 1500 mg via INTRAVENOUS
  Filled 2017-06-29: qty 1500

## 2017-06-29 MED ORDER — PROPOFOL 10 MG/ML IV BOLUS
INTRAVENOUS | Status: DC | PRN
  Administered 2017-06-29: 160 mg via INTRAVENOUS
  Administered 2017-06-29: 40 mg via INTRAVENOUS

## 2017-06-29 MED ORDER — HYDROCODONE-ACETAMINOPHEN 5-325 MG PO TABS: 1.0000 | ORAL_TABLET | ORAL | 0 refills | Status: DC | PRN

## 2017-06-29 MED ORDER — FENTANYL CITRATE (PF) 100 MCG/2ML IJ SOLN
25.0000 ug | INTRAMUSCULAR | Status: DC | PRN
  Administered 2017-06-29 (×3): 50 ug via INTRAVENOUS
  Filled 2017-06-29: qty 2

## 2017-06-29 MED ORDER — EPINEPHRINE PF 1 MG/ML IJ SOLN
INTRAMUSCULAR | Status: DC | PRN
  Administered 2017-06-29: 3000 mL

## 2017-06-29 MED ORDER — KETOROLAC TROMETHAMINE 30 MG/ML IJ SOLN
INTRAMUSCULAR | Status: AC
  Filled 2017-06-29: qty 1

## 2017-06-29 MED ORDER — LIDOCAINE HCL (PF) 1 % IJ SOLN
INTRAMUSCULAR | Status: AC
  Filled 2017-06-29: qty 5

## 2017-06-29 MED ORDER — LACTATED RINGERS IV SOLN
INTRAVENOUS | Status: DC
  Administered 2017-06-29: 09:00:00 via INTRAVENOUS

## 2017-06-29 MED ORDER — SODIUM CHLORIDE 0.9 % IJ SOLN
INTRAMUSCULAR | Status: AC
  Filled 2017-06-29: qty 10

## 2017-06-29 MED ORDER — SUCCINYLCHOLINE CHLORIDE 20 MG/ML IJ SOLN
INTRAMUSCULAR | Status: DC | PRN
  Administered 2017-06-29: 120 mg via INTRAVENOUS

## 2017-06-29 MED ORDER — SODIUM CHLORIDE 0.9 % IR SOLN
Status: DC | PRN
  Administered 2017-06-29: 1000 mL

## 2017-06-29 MED ORDER — EPHEDRINE SULFATE 50 MG/ML IJ SOLN
INTRAMUSCULAR | Status: AC
  Filled 2017-06-29: qty 1

## 2017-06-29 MED ORDER — CHLORHEXIDINE GLUCONATE 4 % EX LIQD: 60.0000 mL | Freq: Once | CUTANEOUS | Status: DC

## 2017-06-29 SURGICAL SUPPLY — 58 items
ARTHROWAND PARAGON T2 (SURGICAL WAND)
BAG HAMPER (MISCELLANEOUS) ×2 IMPLANT
BANDAGE ELASTIC 6 LF NS (GAUZE/BANDAGES/DRESSINGS) ×2 IMPLANT
BLADE AGGRESSIVE PLUS 4.0 (BLADE) ×2 IMPLANT
BLADE SURG SZ11 CARB STEEL (BLADE) ×2 IMPLANT
BNDG CMPR MED 5X6 ELC HKLP NS (GAUZE/BANDAGES/DRESSINGS) ×1
CHLORAPREP W/TINT 26ML (MISCELLANEOUS) ×2 IMPLANT
CLOTH BEACON ORANGE TIMEOUT ST (SAFETY) ×2 IMPLANT
COOLER CRYO IC GRAV AND TUBE (ORTHOPEDIC SUPPLIES) ×2 IMPLANT
CUFF CRYO KNEE LG 20X31 COOLER (ORTHOPEDIC SUPPLIES) ×1 IMPLANT
CUFF CRYO KNEE18X23 MED (MISCELLANEOUS) IMPLANT
CUFF TOURNIQUET SINGLE 34IN LL (TOURNIQUET CUFF) ×1 IMPLANT
CUTTER ANGLED DBL BITE 4.5 (BURR) IMPLANT
DECANTER SPIKE VIAL GLASS SM (MISCELLANEOUS) ×4 IMPLANT
GAUZE SPONGE 4X4 12PLY STRL (GAUZE/BANDAGES/DRESSINGS) ×2 IMPLANT
GAUZE SPONGE 4X4 16PLY XRAY LF (GAUZE/BANDAGES/DRESSINGS) ×2 IMPLANT
GAUZE XEROFORM 5X9 LF (GAUZE/BANDAGES/DRESSINGS) ×2 IMPLANT
GLOVE BIOGEL PI IND STRL 6.5 (GLOVE) IMPLANT
GLOVE BIOGEL PI IND STRL 7.0 (GLOVE) ×1 IMPLANT
GLOVE BIOGEL PI IND STRL 7.5 (GLOVE) IMPLANT
GLOVE BIOGEL PI INDICATOR 6.5 (GLOVE) ×1
GLOVE BIOGEL PI INDICATOR 7.0 (GLOVE) ×1
GLOVE BIOGEL PI INDICATOR 7.5 (GLOVE) ×1
GLOVE SKINSENSE NS SZ8.0 LF (GLOVE) ×1
GLOVE SKINSENSE STRL SZ8.0 LF (GLOVE) ×1 IMPLANT
GLOVE SS N UNI LF 8.5 STRL (GLOVE) ×2 IMPLANT
GOWN STRL REUS W/ TWL LRG LVL3 (GOWN DISPOSABLE) ×1 IMPLANT
GOWN STRL REUS W/TWL LRG LVL3 (GOWN DISPOSABLE) ×2
GOWN STRL REUS W/TWL XL LVL3 (GOWN DISPOSABLE) ×2 IMPLANT
HLDR LEG FOAM (MISCELLANEOUS) ×1 IMPLANT
IV NS IRRIG 3000ML ARTHROMATIC (IV SOLUTION) ×4 IMPLANT
KIT BLADEGUARD II DBL (SET/KITS/TRAYS/PACK) ×2 IMPLANT
KIT ROOM TURNOVER AP CYSTO (KITS) ×2 IMPLANT
LEG HOLDER FOAM (MISCELLANEOUS) ×1
MANIFOLD NEPTUNE II (INSTRUMENTS) ×2 IMPLANT
MARKER SKIN DUAL TIP RULER LAB (MISCELLANEOUS) ×2 IMPLANT
NDL HYPO 18GX1.5 BLUNT FILL (NEEDLE) ×1 IMPLANT
NDL HYPO 21X1.5 SAFETY (NEEDLE) ×1 IMPLANT
NDL SPNL 18GX3.5 QUINCKE PK (NEEDLE) ×1 IMPLANT
NEEDLE HYPO 18GX1.5 BLUNT FILL (NEEDLE) ×2 IMPLANT
NEEDLE HYPO 21X1.5 SAFETY (NEEDLE) ×2 IMPLANT
NEEDLE SPNL 18GX3.5 QUINCKE PK (NEEDLE) ×2 IMPLANT
NS IRRIG 1000ML POUR BTL (IV SOLUTION) ×2 IMPLANT
PACK ARTHRO LIMB DRAPE STRL (MISCELLANEOUS) ×2 IMPLANT
PAD ABD 5X9 TENDERSORB (GAUZE/BANDAGES/DRESSINGS) ×2 IMPLANT
PAD ARMBOARD 7.5X6 YLW CONV (MISCELLANEOUS) ×2 IMPLANT
PADDING CAST COTTON 6X4 STRL (CAST SUPPLIES) ×2 IMPLANT
PADDING WEBRIL 6 STERILE (GAUZE/BANDAGES/DRESSINGS) ×1 IMPLANT
PROBE BIPOLAR 50 DEGREE SUCT (MISCELLANEOUS) ×1 IMPLANT
PROBE BIPOLAR ATHRO 135MM 90D (MISCELLANEOUS) IMPLANT
SET ARTHROSCOPY INST (INSTRUMENTS) ×2 IMPLANT
SET ARTHROSCOPY PUMP TUBE (IRRIGATION / IRRIGATOR) ×2 IMPLANT
SET BASIN LINEN APH (SET/KITS/TRAYS/PACK) ×2 IMPLANT
SUT ETHILON 3 0 FSL (SUTURE) ×2 IMPLANT
SYR 30ML LL (SYRINGE) ×2 IMPLANT
SYRINGE 10CC LL (SYRINGE) ×2 IMPLANT
TUBE CONNECTING 12X1/4 (SUCTIONS) ×6 IMPLANT
WAND ARTHRO PARAGON T2 (SURGICAL WAND) IMPLANT

## 2017-06-29 NOTE — Interval H&P Note (Signed)
History and Physical Interval Note:  06/29/2017 9:41 AM BP (!) 113/59   Pulse 74   Temp 98.1 F (36.7 C) (Oral)   Resp 18   LMP 06/19/2017 (Exact Date)   SpO2 98%   Janice Gillespie  has presented today for surgery, with the diagnosis of RIGHT MEDIAL MENISCUS TEAR  The various methods of treatment have been discussed with the patient and family. After consideration of risks, benefits and other options for treatment, the patient has consented to  Procedure(s): KNEE ARTHROSCOPY WITH MEDIAL MENISECTOMY (Right) as a surgical intervention .  The patient's history has been reviewed, patient examined, no change in status, stable for surgery.  I have reviewed the patient's chart and labs.  Questions were answered to the patient's satisfaction.     Arther Abbott

## 2017-06-29 NOTE — Anesthesia Postprocedure Evaluation (Signed)
Anesthesia Post Note  Patient: ALAYNAH SCHUTTER  Procedure(s) Performed: Procedure(s) (LRB): KNEE ARTHROSCOPY WITH PARTICAL MEDIAL MENISECTOMY (Right)  Patient location during evaluation: PACU Anesthesia Type: General Level of consciousness: awake and alert, oriented and patient cooperative Pain management: pain level controlled Vital Signs Assessment: post-procedure vital signs reviewed and stable Respiratory status: spontaneous breathing and patient connected to face mask oxygen Cardiovascular status: blood pressure returned to baseline and stable Postop Assessment: no headache, adequate PO intake, no backache and no signs of nausea or vomiting Anesthetic complications: no     Last Vitals:  Vitals:   06/29/17 0930 06/29/17 1105  BP: (!) 113/59 115/64  Pulse:    Resp: 18   Temp:  36.6 C  SpO2: 98%     Last Pain:  Vitals:   06/29/17 0903  TempSrc: Oral                 Les Longmore

## 2017-06-29 NOTE — Discharge Instructions (Signed)

## 2017-06-29 NOTE — Op Note (Signed)
06/29/2017  10:57 AM  PATIENT:  Janice Gillespie  41 y.o. female  PRE-OPERATIVE DIAGNOSIS:  RIGHT MEDIAL MENISCUS TEAR  POST-OPERATIVE DIAGNOSIS:  RIGHT MEDIAL MENISCUS TEAR  PROCEDURE:  Procedure(s): KNEE ARTHROSCOPY WITH PARTICAL MEDIAL MENISECTOMY (Right)  SURGEON:  Surgeon(s) and Role:    Carole Civil, MD - Primary  PHYSICIAN ASSISTANT:   ASSISTANTS: none   ANESTHESIA:   general  EBL:  No intake/output data recorded.  BLOOD ADMINISTERED:none  DRAINS: none   LOCAL MEDICATIONS USED:  MARCAINE   , Amount: 60 ml and OTHER epi  SPECIMEN:  No Specimen  DISPOSITION OF SPECIMEN:  N/A  COUNTS:  YES  TOURNIQUET:    DICTATION: .Dragon Dictation  PLAN OF CARE: Discharge to home after PACU  PATIENT DISPOSITION:  PACU - hemodynamically stable.   Delay start of Pharmacological VTE agent (>24hrs) due to surgical blood loss or risk of bleeding: not applicable  78295  Knee arthroscopy dictation  The patient was identified in the preoperative holding area using 2 approved identification mechanisms. The chart was reviewed and updated. The surgical site was confirmed as right  knee and marked with an indelible marker.  The patient was taken to the operating room for anesthesia. After successful  General LMA  anesthesia, 1.5 GM VANCOMYCIN was used as IV antibiotics.  The patient was placed in the supine position with the (RIGHT) the operative extremity in an arthroscopic leg holder and the opposite extremity in a padded leg holder.  The timeout was executed.  A lateral portal was established with an 11 blade and the scope was introduced into the joint. A diagnostic arthroscopy was performed in circumferential manner examining the entire knee joint. A medial portal was established and the diagnostic arthroscopy was repeated using a probe to palpate intra-articular structures as they were encountered.   The operative findings are   Medial mild chondromalacia  medial femoral condyle, torn medial meniscus posterior horn root, small chondral flap medial femoral condyle Lateral normal meniscus and articular cartilage Patellofemoral normal patellofemoral joint Notch normal anterior cruciate ligament PCL   The medial meniscus was resected using a duckbill forceps. The meniscal fragments were removed with a motorized shaver. The meniscus was balanced with a combination of a motorized shaver and a 50 wand until a stable rim was obtained.  The chondral flap was debrided with a motorized shaver  The arthroscopic pump was placed on the wash mode and any excess debris was removed from the joint using suction.  60 cc of Marcaine with epinephrine was injected through the arthroscope.  The portals were closed with 3-0 nylon suture.  A sterile bandage, Ace wrap and Cryo/Cuff was placed and the Cryo/Cuff was activated. The patient was taken to the recovery room in stable condition.

## 2017-06-29 NOTE — Brief Op Note (Addendum)
06/29/2017  10:57 AM  PATIENT:  Janice Gillespie  41 y.o. female  PRE-OPERATIVE DIAGNOSIS:  RIGHT MEDIAL MENISCUS TEAR  POST-OPERATIVE DIAGNOSIS:  RIGHT MEDIAL MENISCUS TEAR  PROCEDURE:  Procedure(s): KNEE ARTHROSCOPY WITH PARTICAL MEDIAL MENISECTOMY (Right)  SURGEON:  Surgeon(s) and Role:    Carole Civil, MD - Primary  PHYSICIAN ASSISTANT:   ASSISTANTS: none   ANESTHESIA:   general  EBL:  No intake/output data recorded.  BLOOD ADMINISTERED:none  DRAINS: none   LOCAL MEDICATIONS USED:  MARCAINE   , Amount: 60 ml and OTHER epi  SPECIMEN:  No Specimen  DISPOSITION OF SPECIMEN:  N/A  COUNTS:  YES  TOURNIQUET:    DICTATION: .Dragon Dictation  PLAN OF CARE: Discharge to home after PACU  PATIENT DISPOSITION:  PACU - hemodynamically stable.   Delay start of Pharmacological VTE agent (>24hrs) due to surgical blood loss or risk of bleeding: not applicable  02774  Knee arthroscopy dictation  The patient was identified in the preoperative holding area using 2 approved identification mechanisms. The chart was reviewed and updated. The surgical site was confirmed as right  knee and marked with an indelible marker.  The patient was taken to the operating room for anesthesia. After successful  General LMA  anesthesia, 1.5 GM VANCOMYCIN was used as IV antibiotics.  The patient was placed in the supine position with the (RIGHT) the operative extremity in an arthroscopic leg holder and the opposite extremity in a padded leg holder.  The timeout was executed.  A lateral portal was established with an 11 blade and the scope was introduced into the joint. A diagnostic arthroscopy was performed in circumferential manner examining the entire knee joint. A medial portal was established and the diagnostic arthroscopy was repeated using a probe to palpate intra-articular structures as they were encountered.   The operative findings are   Medial mild chondromalacia  medial femoral condyle, torn medial meniscus posterior horn root, small chondral flap medial femoral condyle Lateral normal meniscus and articular cartilage Patellofemoral normal patellofemoral joint Notch normal anterior cruciate ligament PCL   The medial meniscus was resected using a duckbill forceps. The meniscal fragments were removed with a motorized shaver. The meniscus was balanced with a combination of a motorized shaver and a 50 wand until a stable rim was obtained.  The chondral flap was debrided with a motorized shaver  The arthroscopic pump was placed on the wash mode and any excess debris was removed from the joint using suction.  60 cc of Marcaine with epinephrine was injected through the arthroscope.  The portals were closed with 3-0 nylon suture.  A sterile bandage, Ace wrap and Cryo/Cuff was placed and the Cryo/Cuff was activated. The patient was taken to the recovery room in stable condition.

## 2017-06-29 NOTE — Transfer of Care (Signed)
Immediate Anesthesia Transfer of Care Note  Patient: Janice Gillespie  Procedure(s) Performed: Procedure(s): KNEE ARTHROSCOPY WITH PARTICAL MEDIAL MENISECTOMY (Right)  Patient Location: PACU  Anesthesia Type:General  Level of Consciousness: awake, alert , oriented and patient cooperative  Airway & Oxygen Therapy: Patient Spontanous Breathing and Patient connected to face mask oxygen  Post-op Assessment: Report given to RN and Post -op Vital signs reviewed and stable  Post vital signs: Reviewed and stable  Last Vitals:  Vitals:   06/29/17 0925 06/29/17 0930  BP: (!) 99/57 (!) 113/59  Pulse:    Resp: 19 18  Temp:    SpO2: 97% 98%    Last Pain:  Vitals:   06/29/17 0903  TempSrc: Oral         Complications: No apparent anesthesia complications

## 2017-06-29 NOTE — Anesthesia Preprocedure Evaluation (Signed)
Anesthesia Evaluation  Patient identified by MRN, date of birth, ID band Patient awake    Reviewed: Allergy & Precautions, H&P , NPO status , Patient's Chart, lab work & pertinent test results  Airway Mallampati: II  TM Distance: >3 FB Neck ROM: Full    Dental  (+) Teeth Intact   Pulmonary Current Smoker,    breath sounds clear to auscultation       Cardiovascular negative cardio ROS   Rhythm:Regular Rate:Normal     Neuro/Psych    GI/Hepatic negative GI ROS, Neg liver ROS,   Endo/Other  Morbid obesity  Renal/GU negative Renal ROS     Musculoskeletal   Abdominal   Peds  Hematology   Anesthesia Other Findings   Reproductive/Obstetrics                             Anesthesia Physical Anesthesia Plan  ASA: II  Anesthesia Plan: General   Post-op Pain Management:    Induction: Intravenous, Rapid sequence and Cricoid pressure planned  PONV Risk Score and Plan:   Airway Management Planned: Oral ETT  Additional Equipment:   Intra-op Plan:   Post-operative Plan: Extubation in OR  Informed Consent: I have reviewed the patients History and Physical, chart, labs and discussed the procedure including the risks, benefits and alternatives for the proposed anesthesia with the patient or authorized representative who has indicated his/her understanding and acceptance.     Plan Discussed with:   Anesthesia Plan Comments:         Anesthesia Quick Evaluation

## 2017-06-30 ENCOUNTER — Encounter (HOSPITAL_COMMUNITY): Payer: Self-pay | Admitting: Orthopedic Surgery

## 2017-07-03 ENCOUNTER — Encounter: Payer: Self-pay | Admitting: Orthopedic Surgery

## 2017-07-03 ENCOUNTER — Ambulatory Visit: Payer: 59 | Admitting: Orthopedic Surgery

## 2017-07-03 ENCOUNTER — Ambulatory Visit (INDEPENDENT_AMBULATORY_CARE_PROVIDER_SITE_OTHER): Payer: 59 | Admitting: Orthopedic Surgery

## 2017-07-03 DIAGNOSIS — Z4889 Encounter for other specified surgical aftercare: Secondary | ICD-10-CM

## 2017-07-03 DIAGNOSIS — Z9889 Other specified postprocedural states: Secondary | ICD-10-CM

## 2017-07-03 MED ORDER — HYDROCODONE-ACETAMINOPHEN 5-325 MG PO TABS: 1.0000 | ORAL_TABLET | ORAL | 0 refills | Status: DC | PRN

## 2017-07-03 NOTE — Progress Notes (Signed)
Post op visit 1   Chief Complaint  Patient presents with  . Post-op Follow-up    5 Days s/p Right knee scope    The operative findings are   Medial mild chondromalacia medial femoral condyle, torn medial meniscus posterior horn root, small chondral flap medial femoral condyle Lateral normal meniscus and articular cartilage Patellofemoral normal patellofemoral joint Notch normal anterior cruciate ligament PCL  Chief complaint soreness  The knee looks good she can bend to 90. She still has some soreness in the knee and we have advised her to start a home exercise program to perform of the next 3 weeks we refilled her. Pain medication   Encounter Diagnoses  Name Primary?  Marland Kitchen Aftercare following surgery Yes  . S/P right knee arthroscopy    Meds ordered this encounter  Medications  . HYDROcodone-acetaminophen (NORCO/VICODIN) 5-325 MG tablet    Sig: Take 1 tablet by mouth every 4 (four) hours as needed for moderate pain.    Dispense:  42 tablet    Refill:  0    She will follow-up in 3 weeks for repeat postop visit

## 2017-08-01 ENCOUNTER — Ambulatory Visit (INDEPENDENT_AMBULATORY_CARE_PROVIDER_SITE_OTHER): Payer: 59 | Admitting: Orthopedic Surgery

## 2017-08-01 ENCOUNTER — Encounter: Payer: Self-pay | Admitting: Orthopedic Surgery

## 2017-08-01 DIAGNOSIS — Z9889 Other specified postprocedural states: Secondary | ICD-10-CM

## 2017-08-01 DIAGNOSIS — Z4889 Encounter for other specified surgical aftercare: Secondary | ICD-10-CM

## 2017-08-01 MED ORDER — HYDROCODONE-ACETAMINOPHEN 5-325 MG PO TABS: 1.0000 | ORAL_TABLET | Freq: Three times a day (TID) | ORAL | 0 refills | Status: DC | PRN

## 2017-08-01 MED ORDER — IBUPROFEN 400 MG PO TABS: 400.0000 mg | ORAL_TABLET | Freq: Three times a day (TID) | ORAL | 0 refills | Status: DC | PRN

## 2017-08-01 NOTE — Patient Instructions (Signed)
Work note   No lifting over 10 lbs for next 4 weeks

## 2017-08-01 NOTE — Progress Notes (Signed)
Postop visit status post knee arthroscopy  The patient reports she is doing very well. She was back to work with a lifting restriction  Portal sites are clean knee flexion is 125. She has good quadriceps extension   06/29/2017  PATIENT:  Janice Gillespie  41 y.o. female   PRE-OPERATIVE DIAGNOSIS:  RIGHT MEDIAL MENISCUS TEAR   POST-OPERATIVE DIAGNOSIS:  RIGHT MEDIAL MENISCUS TEAR   PROCEDURE:  Procedure(s): KNEE ARTHROSCOPY WITH PARTICAL MEDIAL MENISECTOMY (Right)   SURGEON:  Surgeon(s) and Role:    Carole Civil, MD - Primary  HPI The operative findings are   Medial mild chondromalacia medial femoral condyle, torn medial meniscus posterior horn root, small chondral flap medial femoral condyle Lateral normal meniscus and articular cartilage Patellofemoral normal patellofemoral joint Notch normal anterior cruciate ligament PCL  Meds ordered this encounter  Medications  . HYDROcodone-acetaminophen (NORCO/VICODIN) 5-325 MG tablet    Sig: Take 1 tablet by mouth every 8 (eight) hours as needed for moderate pain.    Dispense:  21 tablet    Refill:  0  . ibuprofen (ADVIL,MOTRIN) 400 MG tablet    Sig: Take 1 tablet (400 mg total) by mouth every 8 (eight) hours as needed.    Dispense:  90 tablet    Refill:  0   She will continue her home exercises and return to work with a 10 pound lifting restriction for 4 weeks Encounter Diagnoses  Name Primary?  Marland Kitchen Aftercare following surgery Yes  . S/P right knee arthroscopy

## 2017-08-01 NOTE — Progress Notes (Signed)
06/29/2017   10:57 AM   PATIENT:  Janice Gillespie  41 y.o. female   PRE-OPERATIVE DIAGNOSIS:  RIGHT MEDIAL MENISCUS TEAR   POST-OPERATIVE DIAGNOSIS:  RIGHT MEDIAL MENISCUS TEAR   PROCEDURE:  Procedure(s): KNEE ARTHROSCOPY WITH PARTICAL MEDIAL MENISECTOMY (Right)   SURGEON:  Surgeon(s) and Role:    Carole Civil, MD - Primary The operative findings are    Medial mild chondromalacia medial femoral condyle, torn medial meniscus posterior horn root, small chondral flap medial femoral condyle Lateral normal meniscus and articular cartilage Patellofemoral normal patellofemoral joint Notch normal anterior cruciate ligament PCL

## 2017-09-26 DIAGNOSIS — S80869A Insect bite (nonvenomous), unspecified lower leg, initial encounter: Secondary | ICD-10-CM | POA: Diagnosis not present

## 2018-08-04 DIAGNOSIS — Z6841 Body Mass Index (BMI) 40.0 and over, adult: Secondary | ICD-10-CM | POA: Diagnosis not present

## 2018-08-04 DIAGNOSIS — R600 Localized edema: Secondary | ICD-10-CM | POA: Diagnosis not present

## 2018-08-04 DIAGNOSIS — R609 Edema, unspecified: Secondary | ICD-10-CM | POA: Diagnosis not present

## 2018-09-12 DIAGNOSIS — Z6841 Body Mass Index (BMI) 40.0 and over, adult: Secondary | ICD-10-CM | POA: Diagnosis not present

## 2018-09-12 DIAGNOSIS — J060 Acute laryngopharyngitis: Secondary | ICD-10-CM | POA: Diagnosis not present

## 2021-01-06 ENCOUNTER — Encounter (INDEPENDENT_AMBULATORY_CARE_PROVIDER_SITE_OTHER): Payer: Self-pay | Admitting: *Deleted

## 2021-03-24 ENCOUNTER — Telehealth (INDEPENDENT_AMBULATORY_CARE_PROVIDER_SITE_OTHER): Payer: Self-pay

## 2021-03-24 ENCOUNTER — Encounter (INDEPENDENT_AMBULATORY_CARE_PROVIDER_SITE_OTHER): Payer: Self-pay

## 2021-03-24 ENCOUNTER — Other Ambulatory Visit (INDEPENDENT_AMBULATORY_CARE_PROVIDER_SITE_OTHER): Payer: Self-pay

## 2021-03-24 DIAGNOSIS — Z1211 Encounter for screening for malignant neoplasm of colon: Secondary | ICD-10-CM

## 2021-03-24 MED ORDER — PEG 3350-KCL-NA BICARB-NACL 420 G PO SOLR: 4000.0000 mL | ORAL | 0 refills | Status: DC

## 2021-03-24 NOTE — Telephone Encounter (Signed)
Ok to schedule.  Thanks,  Bodhi Stenglein Castaneda Mayorga, MD Gastroenterology and Hepatology [facility]  

## 2021-03-24 NOTE — Telephone Encounter (Signed)
Referring MD/PCP: VYAS  Procedure: Tcs  Reason/Indication:  Screening  Has patient had this procedure before?  no  If so, when, by whom and where?    Is there a family history of colon cancer?  no  Who?  What age when diagnosed?    Is patient diabetic? If yes, Type 1 or Type 2   Yes Type 2      Does patient have prosthetic heart valve or mechanical valve?  no  Do you have a pacemaker/defibrillator?  no  Has patient ever had endocarditis/atrial fibrillation? no  Have you had a stroke/heart attack last 6 mths? no  Does patient use oxygen? no  Has patient had joint replacement within last 12 months?  no  Is patient constipated or do they take laxatives? no  Does patient have a history of alcohol/drug use?  no  Is patient on blood thinner such as Coumadin, Plavix and/or Aspirin? no  Do you take medicine for weight loss?  no  For female patients,: do you still have your menstrual cycle? yes  Medications: Metformin 500 mg daily, Meloxicam 15 mg daily, triamcinolone lotion  Allergies: pcn  Medication Adjustment per Dr Rehman/Dr Jenetta Downer Hold Metformin the evening prior and the morning of your procedure  Procedure date & time: 04/09/21 at 12:05

## 2021-03-24 NOTE — Telephone Encounter (Signed)
Janice Gillespie, CMA  

## 2021-04-05 ENCOUNTER — Other Ambulatory Visit (INDEPENDENT_AMBULATORY_CARE_PROVIDER_SITE_OTHER): Payer: Self-pay

## 2021-04-05 NOTE — Patient Instructions (Signed)
Janice Gillespie  04/05/2021     @PREFPERIOPPHARMACY @   Your procedure is scheduled on 04/09/2021.   Report to [facility] at  1000  A.M.   Call this number if you have problems the morning of surgery:  [phone]   Remember:  Follow the diet and prep instructions given to you by the office.                      Take these medicines the morning of surgery with A SIP OF WATER  mobic (if needed).  DO NOT take any medications for diabetes the morning of your procedure.  If your glucose is 70 or below the morning of your procedure, drink 1/2 cup of clear liquid containing sugar and recheck your glucose in15 minutes. If your glucose is still 70 or below, call [phone] for instructions.  If your glucose is 300 or above the morning of your procedure, call [phone] for instructions.     Please brush your teeth.  Do not wear jewelry, make-up or nail polish.  Do not wear lotions, powders, or perfumes, or deodorant.  Do not shave 48 hours prior to surgery.  Men may shave face and neck.  Do not bring valuables to the hospital.  [facility] is not responsible for any belongings or valuables.  Contacts, dentures or bridgework may not be worn into surgery.  Leave your suitcase in the car.  After surgery it may be brought to your room.  For patients admitted to the hospital, discharge time will be determined by your treatment team.  Patients discharged the day of surgery will not be allowed to drive home and must have someone with them for 24 hours.   Special instructions:  DO NOT smoke tobacco or vape for 24 hours before your procedure.  Please read over the following fact sheets that you were given. Anesthesia Post-op Instructions and Care and Recovery After Surgery       Colonoscopy, Adult, Care After This sheet gives you information about how to care for yourself after your procedure. Your health care provider may also give you more specific instructions.  If you have problems or questions, contact your health care provider. What can I expect after the procedure? After the procedure, it is common to have:  A small amount of blood in your stool for 24 hours after the procedure.  Some gas.  Mild cramping or bloating of your abdomen. Follow these instructions at home: Eating and drinking  Drink enough fluid to keep your urine pale yellow.  Follow instructions from your health care provider about eating or drinking restrictions.  Resume your normal diet as instructed by your health care provider. Avoid heavy or fried foods that are hard to digest.   Activity  Rest as told by your health care provider.  Avoid sitting for a long time without moving. Get up to take short walks every 1-2 hours. This is important to improve blood flow and breathing. Ask for help if you feel weak or unsteady.  Return to your normal activities as told by your health care provider. Ask your health care provider what activities are safe for you. Managing cramping and bloating  Try walking around when you have cramps or feel bloated.  Apply heat to your abdomen as told by your health care provider. Use the heat source that your health care provider recommends, such as a moist heat pack or a heating pad. ?  Place a towel between your skin and the heat source. ? Leave the heat on for 20-30 minutes. ? Remove the heat if your skin turns bright red. This is especially important if you are unable to feel pain, heat, or cold. You may have a greater risk of getting burned.   General instructions  If you were given a sedative during the procedure, it can affect you for several hours. Do not drive or operate machinery until your health care provider says that it is safe.  For the first 24 hours after the procedure: ? Do not sign important documents. ? Do not drink alcohol. ? Do your regular daily activities at a slower pace than normal. ? Eat soft foods that are easy to  digest.  Take over-the-counter and prescription medicines only as told by your health care provider.  Keep all follow-up visits as told by your health care provider. This is important. Contact a health care provider if:  You have blood in your stool 2-3 days after the procedure. Get help right away if you have:  More than a small spotting of blood in your stool.  Large blood clots in your stool.  Swelling of your abdomen.  Nausea or vomiting.  A fever.  Increasing pain in your abdomen that is not relieved with medicine. Summary  After the procedure, it is common to have a small amount of blood in your stool. You may also have mild cramping and bloating of your abdomen.  If you were given a sedative during the procedure, it can affect you for several hours. Do not drive or operate machinery until your health care provider says that it is safe.  Get help right away if you have a lot of blood in your stool, nausea or vomiting, a fever, or increased pain in your abdomen. This information is not intended to replace advice given to you by your health care provider. Make sure you discuss any questions you have with your health care provider. Document Revised: 11/01/2019 Document Reviewed: 06/03/2019 Elsevier Patient Education  2021 [place] After This sheet gives you information about how to care for yourself after your procedure. Your health care provider may also give you more specific instructions. If you have problems or questions, contact your health care provider. What can I expect after the procedure? After the procedure, it is common to have:  Tiredness.  Forgetfulness about what happened after the procedure.  Impaired judgment for important decisions.  Nausea or vomiting.  Some difficulty with balance. Follow these instructions at home: For the time period you were told by your health care provider:  Rest as needed.  Do not  participate in activities where you could fall or become injured.  Do not drive or use machinery.  Do not drink alcohol.  Do not take sleeping pills or medicines that cause drowsiness.  Do not make important decisions or sign legal documents.  Do not take care of children on your own.      Eating and drinking  Follow the diet that is recommended by your health care provider.  Drink enough fluid to keep your urine pale yellow.  If you vomit: ? Drink water, juice, or soup when you can drink without vomiting. ? Make sure you have little or no nausea before eating solid foods. General instructions  Have a responsible adult stay with you for the time you are told. It is important to have someone help care for you  until you are awake and alert.  Take over-the-counter and prescription medicines only as told by your health care provider.  If you have sleep apnea, surgery and certain medicines can increase your risk for breathing problems. Follow instructions from your health care provider about wearing your sleep device: ? Anytime you are sleeping, including during daytime naps. ? While taking prescription pain medicines, sleeping medicines, or medicines that make you drowsy.  Avoid smoking.  Keep all follow-up visits as told by your health care provider. This is important. Contact a health care provider if:  You keep feeling nauseous or you keep vomiting.  You feel light-headed.  You are still sleepy or having trouble with balance after 24 hours.  You develop a rash.  You have a fever.  You have redness or swelling around the IV site. Get help right away if:  You have trouble breathing.  You have new-onset confusion at home. Summary  For several hours after your procedure, you may feel tired. You may also be forgetful and have poor judgment.  Have a responsible adult stay with you for the time you are told. It is important to have someone help care for you until you  are awake and alert.  Rest as told. Do not drive or operate machinery. Do not drink alcohol or take sleeping pills.  Get help right away if you have trouble breathing, or if you suddenly become confused. This information is not intended to replace advice given to you by your health care provider. Make sure you discuss any questions you have with your health care provider. Document Revised: 07/23/2020 Document Reviewed: 10/10/2019 Elsevier Patient Education  2021 [employer].

## 2021-04-06 ENCOUNTER — Other Ambulatory Visit (INDEPENDENT_AMBULATORY_CARE_PROVIDER_SITE_OTHER): Payer: Self-pay

## 2021-04-07 ENCOUNTER — Other Ambulatory Visit (HOSPITAL_COMMUNITY)
Admission: RE | Admit: 2021-04-07 | Discharge: 2021-04-07 | Disposition: A | Discharge status: Home / Self Care | Payer: 59 | Source: Ambulatory Visit | Attending: Gastroenterology | Admitting: Gastroenterology

## 2021-04-07 ENCOUNTER — Encounter (HOSPITAL_COMMUNITY): Payer: Self-pay

## 2021-04-07 ENCOUNTER — Encounter (HOSPITAL_COMMUNITY)
Admission: RE | Admit: 2021-04-07 | Discharge: 2021-04-07 | Disposition: A | Discharge status: Home / Self Care | Payer: 59 | Source: Ambulatory Visit | Attending: Gastroenterology | Admitting: Gastroenterology

## 2021-04-07 ENCOUNTER — Other Ambulatory Visit: Payer: Self-pay

## 2021-04-07 DIAGNOSIS — Z20822 Contact with and (suspected) exposure to covid-19: Secondary | ICD-10-CM | POA: Insufficient documentation

## 2021-04-07 DIAGNOSIS — Z1211 Encounter for screening for malignant neoplasm of colon: Secondary | ICD-10-CM

## 2021-04-07 DIAGNOSIS — Z01812 Encounter for preprocedural laboratory examination: Secondary | ICD-10-CM | POA: Diagnosis present

## 2021-04-07 HISTORY — DX: Sleep apnea, unspecified: G47.30

## 2021-04-07 HISTORY — DX: Prediabetes: R73.03

## 2021-04-07 HISTORY — DX: Unspecified osteoarthritis, unspecified site: M19.90

## 2021-04-07 LAB — BASIC METABOLIC PANEL
Anion gap: 10 (ref 5–15)
BUN: 13 mg/dL (ref 6–20)
CO2: 25 mmol/L (ref 22–32)
Calcium: 8.6 mg/dL — ABNORMAL LOW (ref 8.9–10.3)
Chloride: 101 mmol/L (ref 98–111)
Creatinine, Ser: 0.85 mg/dL (ref 0.44–1.00)
GFR, Estimated: >60 mL/min (ref >60)
Glucose, Bld: 136 mg/dL — ABNORMAL HIGH (ref 70–99)
Potassium: 4.2 mmol/L (ref 3.5–5.1)
Sodium: 136 mmol/L (ref 135–145)

## 2021-04-07 LAB — HCG, SERUM, QUALITATIVE: Preg, Serum: NEGATIVE

## 2021-04-07 LAB — SARS CORONAVIRUS 2 (TAT 6-24 HRS): SARS Coronavirus 2: NEGATIVE

## 2021-04-09 ENCOUNTER — Encounter (HOSPITAL_COMMUNITY): Payer: Self-pay | Admitting: Gastroenterology

## 2021-04-09 ENCOUNTER — Other Ambulatory Visit: Payer: Self-pay

## 2021-04-09 ENCOUNTER — Ambulatory Visit (HOSPITAL_COMMUNITY): Payer: 59 | Admitting: Anesthesiology

## 2021-04-09 ENCOUNTER — Ambulatory Visit (HOSPITAL_COMMUNITY)
Admission: RE | Admit: 2021-04-09 | Discharge: 2021-04-09 | Disposition: A | Discharge status: Home / Self Care | Payer: 59 | Attending: Gastroenterology | Admitting: Gastroenterology

## 2021-04-09 ENCOUNTER — Encounter (HOSPITAL_COMMUNITY)
Admission: RE | Disposition: A | Discharge status: Home / Self Care | Payer: Self-pay | Source: Home / Self Care | Attending: Gastroenterology

## 2021-04-09 DIAGNOSIS — Z8249 Family history of ischemic heart disease and other diseases of the circulatory system: Secondary | ICD-10-CM | POA: Insufficient documentation

## 2021-04-09 DIAGNOSIS — Z880 Allergy status to penicillin: Secondary | ICD-10-CM | POA: Insufficient documentation

## 2021-04-09 DIAGNOSIS — D120 Benign neoplasm of cecum: Secondary | ICD-10-CM | POA: Insufficient documentation

## 2021-04-09 DIAGNOSIS — G4730 Sleep apnea, unspecified: Secondary | ICD-10-CM | POA: Diagnosis not present

## 2021-04-09 DIAGNOSIS — Z791 Long term (current) use of non-steroidal anti-inflammatories (NSAID): Secondary | ICD-10-CM | POA: Diagnosis not present

## 2021-04-09 DIAGNOSIS — Z881 Allergy status to other antibiotic agents status: Secondary | ICD-10-CM | POA: Diagnosis not present

## 2021-04-09 DIAGNOSIS — F17210 Nicotine dependence, cigarettes, uncomplicated: Secondary | ICD-10-CM | POA: Diagnosis not present

## 2021-04-09 DIAGNOSIS — Z1211 Encounter for screening for malignant neoplasm of colon: Secondary | ICD-10-CM | POA: Diagnosis not present

## 2021-04-09 DIAGNOSIS — Z809 Family history of malignant neoplasm, unspecified: Secondary | ICD-10-CM | POA: Insufficient documentation

## 2021-04-09 DIAGNOSIS — Z818 Family history of other mental and behavioral disorders: Secondary | ICD-10-CM | POA: Diagnosis not present

## 2021-04-09 DIAGNOSIS — Z7984 Long term (current) use of oral hypoglycemic drugs: Secondary | ICD-10-CM | POA: Diagnosis not present

## 2021-04-09 DIAGNOSIS — Z888 Allergy status to other drugs, medicaments and biological substances status: Secondary | ICD-10-CM | POA: Diagnosis not present

## 2021-04-09 DIAGNOSIS — Z833 Family history of diabetes mellitus: Secondary | ICD-10-CM | POA: Diagnosis not present

## 2021-04-09 DIAGNOSIS — R7303 Prediabetes: Secondary | ICD-10-CM | POA: Diagnosis not present

## 2021-04-09 HISTORY — PX: COLONOSCOPY WITH PROPOFOL: SHX5780

## 2021-04-09 HISTORY — PX: POLYPECTOMY: SHX5525

## 2021-04-09 LAB — HM COLONOSCOPY

## 2021-04-09 LAB — GLUCOSE, CAPILLARY: Glucose-Capillary: 154 mg/dL — ABNORMAL HIGH (ref 70–99)

## 2021-04-09 SURGERY — COLONOSCOPY WITH PROPOFOL
Anesthesia: General

## 2021-04-09 MED ORDER — PROPOFOL 10 MG/ML IV BOLUS
INTRAVENOUS | Status: DC | PRN
  Administered 2021-04-09: 100 mg via INTRAVENOUS

## 2021-04-09 MED ORDER — LACTATED RINGERS IV SOLN: INTRAVENOUS | Status: DC

## 2021-04-09 MED ORDER — STERILE WATER FOR IRRIGATION IR SOLN
Status: DC | PRN
  Administered 2021-04-09: 100 mL

## 2021-04-09 MED ORDER — PROPOFOL 500 MG/50ML IV EMUL
INTRAVENOUS | Status: DC | PRN
  Administered 2021-04-09: 125 ug/kg/min via INTRAVENOUS

## 2021-04-09 NOTE — Anesthesia Postprocedure Evaluation (Signed)
Anesthesia Post Note  Patient: Janice Gillespie  Procedure(s) Performed: COLONOSCOPY WITH PROPOFOL (N/A ) POLYPECTOMY  Patient location during evaluation: Phase II Anesthesia Type: General Level of consciousness: awake Pain management: pain level controlled Vital Signs Assessment: post-procedure vital signs reviewed and stable Respiratory status: spontaneous breathing and respiratory function stable Cardiovascular status: blood pressure returned to baseline and stable Postop Assessment: no headache and no apparent nausea or vomiting Anesthetic complications: no Comments: Late entry   No complications documented.   Last Vitals:  Vitals:   04/09/21 1038 04/09/21 1154  BP: 121/68 (!) 106/57  Pulse: 81   Resp: 18 20  Temp: 36.8 C 36.5 C  SpO2: 96% 98%    Last Pain:  Vitals:   04/09/21 1154  TempSrc: Oral  PainSc: 0-No pain                 Louann Sjogren

## 2021-04-09 NOTE — Anesthesia Preprocedure Evaluation (Signed)
Anesthesia Evaluation  Patient identified by MRN, date of birth, ID band Patient awake    Reviewed: Allergy & Precautions, H&P , NPO status , Patient's Chart, lab work & pertinent test results, reviewed documented beta blocker date and time   Airway Mallampati: II  TM Distance: >3 FB Neck ROM: full    Dental no notable dental hx.    Pulmonary sleep apnea , Current Smoker and Patient abstained from smoking.,    Pulmonary exam normal breath sounds clear to auscultation       Cardiovascular Exercise Tolerance: Good negative cardio ROS   Rhythm:regular Rate:Normal     Neuro/Psych negative neurological ROS  negative psych ROS   GI/Hepatic negative GI ROS, Neg liver ROS,   Endo/Other  Morbid obesity  Renal/GU negative Renal ROS  negative genitourinary   Musculoskeletal   Abdominal   Peds  Hematology negative hematology ROS (+)   Anesthesia Other Findings   Reproductive/Obstetrics negative OB ROS                             Anesthesia Physical Anesthesia Plan  ASA: III  Anesthesia Plan: General   Post-op Pain Management:    Induction:   PONV Risk Score and Plan: Propofol infusion  Airway Management Planned:   Additional Equipment:   Intra-op Plan:   Post-operative Plan:   Informed Consent: I have reviewed the patients History and Physical, chart, labs and discussed the procedure including the risks, benefits and alternatives for the proposed anesthesia with the patient or authorized representative who has indicated his/her understanding and acceptance.     Dental Advisory Given  Plan Discussed with: CRNA  Anesthesia Plan Comments:         Anesthesia Quick Evaluation

## 2021-04-09 NOTE — Transfer of Care (Signed)
Immediate Anesthesia Transfer of Care Note  Patient: Janice Gillespie  Procedure(s) Performed: COLONOSCOPY WITH PROPOFOL (N/A ) POLYPECTOMY  Patient Location: PACU  Anesthesia Type:General  Level of Consciousness: awake, alert  and oriented  Airway & Oxygen Therapy: Patient Spontanous Breathing  Post-op Assessment: Report given to RN and Post -op Vital signs reviewed and stable  Post vital signs: Reviewed and stable  Last Vitals:  Vitals Value Taken Time  BP 106/57 04/09/21 1154  Temp 36.5 C 04/09/21 1154  Pulse    Resp 20 04/09/21 1154  SpO2 98 % 04/09/21 1154    Last Pain:  Vitals:   04/09/21 1154  TempSrc: Oral  PainSc: 0-No pain      Patients Stated Pain Goal: 7 ([id number])  Complications: No complications documented.

## 2021-04-09 NOTE — Op Note (Signed)
[facility] Patient Name: Janice Gillespie Procedure Date: 04/09/2021 11:19 AM MRN: [id number] Date of Birth: 1976-04-18 Attending MD: Maylon Peppers ,  CSN: [id number] Age: 45 Admit Type: Outpatient Procedure:                Colonoscopy Indications:              Screening for colorectal malignant neoplasm Providers:                Maylon Peppers, Herbert Pun,                            Technician Referring MD:              Medicines:                Monitored Anesthesia Care Complications:            No immediate complications. Estimated Blood Loss:     Estimated blood loss: none. Procedure:                Pre-Anesthesia Assessment:                           - Prior to the procedure, a History and Physical                            was performed, and patient medications, allergies                            and sensitivities were reviewed. The patient's                            tolerance of previous anesthesia was reviewed.                           - The risks and benefits of the procedure and the                            sedation options and risks were discussed with the                            patient. All questions were answered and informed                            consent was obtained.                           - ASA Grade Assessment: I - A normal, healthy                            patient.                           After obtaining informed consent, the colonoscope                            was passed under direct vision. Throughout the  procedure, the patient's blood pressure, pulse, and                            oxygen saturations were monitored continuously. The                            [id number]-[id number] ([id number]) scope was introduced through                            the anus and advanced to the the cecum, identified                            by the ileocecal valve. The colonoscopy was                             performed without difficulty. The patient tolerated                            the procedure well. The quality of the bowel                            preparation was adequate. Scope In: 11:29:53 AM Scope Out: 11:50:37 AM Scope Withdrawal Time: 0 hours 15 minutes 43 seconds  Total Procedure Duration: 0 hours 20 minutes 44 seconds  Findings:      The perianal and digital rectal examinations were normal. Pertinent       negatives include normal sphincter tone.      A 3 mm polyp was found in the cecum. The polyp was sessile. The polyp       was removed with a cold snare. Resection and retrieval were complete.      The retroflexed view of the distal rectum and anal verge was normal and       showed no anal or rectal abnormalities. Impression:               - One 3 mm polyp in the cecum, removed with a cold                            snare. Resected and retrieved.                           - The distal rectum and anal verge are normal on                            retroflexion view. Moderate Sedation:      Per Anesthesia Care Recommendation:           - Discharge patient to home (ambulatory).                           - Resume previous diet.                           - Await pathology results.                           -  Repeat colonoscopy for surveillance based on                            pathology results. Procedure Code(s):        --- Professional ---                           [phone], Colonoscopy, flexible; with removal of                            tumor(s), polyp(s), or other lesion(s) by snare                            technique Diagnosis Code(s):        --- Professional ---                           Z12.11, Encounter for screening for malignant                            neoplasm of colon                           K63.5, Polyp of colon CPT copyright 2019 American [employer]. All rights reserved. The codes documented in this report are preliminary and upon coder  review may  be revised to meet current compliance requirements. Maylon Peppers, MD Maylon Peppers,  04/09/2021 11:57:43 AM This report has been signed electronically. Number of Addenda: 0

## 2021-04-09 NOTE — H&P (Signed)
Janice Gillespie is an 45 y.o. female.   Chief Complaint: screening colonoscopy HPI: 45 year old female with past medical history of prediabetes and sleep apnea, coming for screening colonoscopy. The patient has never had a colonoscopy in the past.  The patient denies having any complaints such as melena, hematochezia, abdominal pain or distention, change in her bowel movement consistency or frequency, no changes in her weight recently.  No family history of colorectal cancer.   Past Medical History:  Diagnosis Date  . Arthritis   . Pre-diabetes   . Sleep apnea     Past Surgical History:  Procedure Laterality Date  . cataract surgery Bilateral   . CESAREAN SECTION    . CYST REMOVAL NECK    . FOREIGN BODY REMOVAL Right 05/05/2014   Procedure: FOREIGN BODY REMOVAL ADULT LEG;  Surgeon: Jamesetta So, MD;  Location: [facility];  Service: General;  Laterality: Right;  . KNEE ARTHROSCOPY WITH MEDIAL MENISECTOMY Right 06/29/2017   Procedure: KNEE ARTHROSCOPY WITH PARTICAL MEDIAL MENISECTOMY;  Surgeon: Carole Civil, MD;  Location: [facility];  Service: Orthopedics;  Laterality: Right;  . TUBAL LIGATION      Family History  Problem Relation Age of Onset  . Diabetes Father   . Heart disease Father   . Diabetes Maternal Grandmother   . Cancer Maternal Grandfather   . Mental illness Paternal Grandmother        dementia   Social History:  reports that she has been smoking cigarettes. She has a 6.25 pack-year smoking history. She has never used smokeless tobacco. She reports that she does not drink alcohol and does not use drugs.  Allergies:  Allergies  Allergen Reactions  . Doxycycline Hives    Really bad hives  . Chantix [Varenicline] Diarrhea and Nausea And Vomiting  . Penicillins Hives and Swelling    Throat swells Has patient had a PCN reaction causing immediate rash, facial/tongue/throat swelling, SOB or lightheadedness with hypotension: Yes Has patient had a PCN reaction causing  severe rash involving mucus membranes or skin necrosis: Unknown Has patient had a PCN reaction that required hospitalization: No Has patient had a PCN reaction occurring within the last 10 years: No If all of the above answers are "NO", then may proceed with Cephalosporin use.     Medications Prior to Admission  Medication Sig Dispense Refill  . acetaminophen (TYLENOL) 500 MG tablet Take 1,000 mg by mouth every 6 (six) hours as needed for mild pain.    . meloxicam (MOBIC) 15 MG tablet Take 15 mg by mouth 2 (two) times daily.    . metFORMIN (GLUCOPHAGE) 500 MG tablet Take 500 mg by mouth 2 (two) times daily.    . polyethylene glycol-electrolytes (TRILYTE) 420 g solution Take 4,000 mLs by mouth as directed. 4000 mL 0  . triamcinolone lotion (KENALOG) 0.1 % Apply 1 application topically 2 (two) times daily as needed for rash.      Results for orders placed or performed during the hospital encounter of 04/09/21 (from the past 48 hour(s))  Glucose, capillary     Status: Abnormal   Collection Time: 04/09/21 10:21 AM  Result Value Ref Range   Glucose-Capillary 154 (H) 70 - 99 mg/dL    Comment: Glucose reference range applies only to samples taken after fasting for at least 8 hours.   No results found.  Review of Systems  Constitutional: Negative.   HENT: Negative.   Eyes: Negative.   Respiratory: Negative.   Cardiovascular: Negative.  Gastrointestinal: Negative.   Endocrine: Negative.   Genitourinary: Negative.   Musculoskeletal: Negative.   Skin: Negative.   Allergic/Immunologic: Negative.   Neurological: Negative.   Hematological: Negative.   Psychiatric/Behavioral: Negative.     Blood pressure 121/68, pulse 81, temperature 98.2 F (36.8 C), temperature source Oral, resp. rate 18, height 5\' 5"  (1.651 m), weight (!) 153.3 kg, SpO2 96 %. Physical Exam  GENERAL: The patient is AO x3, in no acute distress. Obese. HEENT: Head is normocephalic and atraumatic. EOMI are intact.  Mouth is well hydrated and without lesions. NECK: Supple. No masses LUNGS: Clear to auscultation. No presence of rhonchi/wheezing/rales. Adequate chest expansion HEART: RRR, normal s1 and s2. ABDOMEN: Soft, nontender, no guarding, no peritoneal signs, and nondistended. BS +. No masses. EXTREMITIES: Without any cyanosis, clubbing, rash, lesions or edema. NEUROLOGIC: AOx3, no focal motor deficit. SKIN: no jaundice, no rashes  Assessment/Plan 45 year old female with past medical history of prediabetes and sleep apnea, coming for screening colonoscopy. The patient is at average risk for colorectal cancer.  We will proceed with colonoscopy today.   Harvel Quale, MD 04/09/2021, 11:23 AM

## 2021-04-09 NOTE — Discharge Instructions (Signed)
You are being discharged to home.  Resume your previous diet.  We are waiting for your pathology results.  Your physician has recommended a repeat colonoscopy for surveillance based on pathology results.   Colonoscopy, Adult, Care After This sheet gives you information about how to care for yourself after your procedure. Your doctor may also give you more specific instructions. If you have problems or questions, call your doctor. What can I expect after the procedure? After the procedure, it is common to have:  A small amount of blood in your poop (stool) for 24 hours.  Some gas.  Mild cramping or bloating in your belly (abdomen). Follow these instructions at home: Eating and drinking  Drink enough fluid to keep your pee (urine) pale yellow.  Follow instructions from your doctor about what you cannot eat or drink.  Return to your normal diet as told by your doctor. Avoid heavy or fried foods that are hard to digest.   Activity  Rest as told by your doctor.  Do not sit for a long time without moving. Get up to take short walks every 1-2 hours. This is important. Ask for help if you feel weak or unsteady.  Return to your normal activities as told by your doctor. Ask your doctor what activities are safe for you. To help cramping and bloating:  Try walking around.  Put heat on your belly as told by your doctor. Use the heat source that your doctor recommends, such as a moist heat pack or a heating pad. ? Put a towel between your skin and the heat source. ? Leave the heat on for 20-30 minutes. ? Remove the heat if your skin turns bright red. This is very important if you are unable to feel pain, heat, or cold. You may have a greater risk of getting burned.   General instructions  If you were given a medicine to help you relax (sedative) during your procedure, it can affect you for many hours. Do not drive or use machinery until your doctor says that it is safe.  For the first 24  hours after the procedure: ? Do not sign important documents. ? Do not drink alcohol. ? Do your daily activities more slowly than normal. ? Eat foods that are soft and easy to digest.  Take over-the-counter or prescription medicines only as told by your doctor.  Keep all follow-up visits as told by your doctor. This is important. Contact a doctor if:  You have blood in your poop 2-3 days after the procedure. Get help right away if:  You have more than a small amount of blood in your poop.  You see large clumps of tissue (blood clots) in your poop.  Your belly is swollen.  You feel like you may vomit (nauseous).  You vomit.  You have a fever.  You have belly pain that gets worse, and medicine does not help your pain. Summary  After the procedure, it is common to have a small amount of blood in your poop. You may also have mild cramping and bloating in your belly.  If you were given a medicine to help you relax (sedative) during your procedure, it can affect you for many hours. Do not drive or use machinery until your doctor says that it is safe.  Get help right away if you have a lot of blood in your poop, feel like you may vomit, have a fever, or have more belly pain. This information is not intended  to replace advice given to you by your health care provider. Make sure you discuss any questions you have with your health care provider. Document Revised: 09/13/2019 Document Reviewed: 06/03/2019 Elsevier Patient Education  [place].  Colon Polyps  Colon polyps are tissue growths inside the colon, which is part of the large intestine. They are one of the types of polyps that can grow in the body. A polyp may be a round bump or a mushroom-shaped growth. You could have one polyp or more than one. Most colon polyps are noncancerous (benign). However, some colon polyps can become cancerous over time. Finding and removing the polyps early can help prevent this. What are  the causes? The exact cause of colon polyps is not known. What increases the risk? The following factors may make you more likely to develop this condition:  Having a family history of colorectal cancer or colon polyps.  Being older than 47 years of age.  Being younger than 47 years of age and having a significant family history of colorectal cancer or colon polyps or a genetic condition that puts you at higher risk of getting colon polyps.  Having inflammatory bowel disease, such as ulcerative colitis or Crohn's disease.  Having certain conditions passed from parent to child (hereditary conditions), such as: ? Familial adenomatous polyposis (FAP). ? Lynch syndrome. ? Turcot syndrome. ? Peutz-Jeghers syndrome. ? MUTYH-associated polyposis (MAP).  Being overweight.  Certain lifestyle factors. These include smoking cigarettes, drinking too much alcohol, not getting enough exercise, and eating a diet that is high in fat and red meat and low in fiber.  Having had childhood cancer that was treated with radiation of the abdomen. What are the signs or symptoms? Many times, there are no symptoms. If you have symptoms, they may include:  Blood coming from the rectum during a bowel movement.  Blood in the stool (feces). The blood may be bright red or very dark in color.  Pain in the abdomen.  A change in bowel habits, such as constipation or diarrhea. How is this diagnosed? This condition is diagnosed with a colonoscopy. This is a procedure in which a lighted, flexible scope is inserted into the opening between the buttocks (anus) and then passed into the colon to examine the area. Polyps are sometimes found when a colonoscopy is done as part of routine cancer screening tests. How is this treated? This condition is treated by removing any polyps that are found. Most polyps can be removed during a colonoscopy. Those polyps will then be tested for cancer. Additional treatment may be needed  depending on the results of testing. Follow these instructions at home: Eating and drinking  Eat foods that are high in fiber, such as fruits, vegetables, and whole grains.  Eat foods that are high in calcium and vitamin D, such as milk, cheese, yogurt, eggs, liver, fish, and broccoli.  Limit foods that are high in fat, such as fried foods and desserts.  Limit the amount of red meat, precooked or cured meat, or other processed meat that you eat, such as hot dogs, sausages, bacon, or meat loaves.  Limit sugary drinks.   Lifestyle  Maintain a healthy weight, or lose weight if recommended by your health care provider.  Exercise every day or as told by your health care provider.  Do not use any products that contain nicotine or tobacco, such as cigarettes, e-cigarettes, and chewing tobacco. If you need help quitting, ask your health care provider.  Do not  drink alcohol if: ? Your health care provider tells you not to drink. ? You are pregnant, may be pregnant, or are planning to become pregnant.  If you drink alcohol: ? Limit how much you use to:  0-1 drink a day for women.  0-2 drinks a day for men. ? Know how much alcohol is in your drink. In the [place] one drink equals one 12 oz bottle of beer (355 mL), one 5 oz glass of wine (148 mL), or one 1 oz glass of hard liquor (44 mL). General instructions  Take over-the-counter and prescription medicines only as told by your health care provider.  Keep all follow-up visits. This is important. This includes having regularly scheduled colonoscopies. Talk to your health care provider about when you need a colonoscopy. Contact a health care provider if:  You have new or worsening bleeding during a bowel movement.  You have new or increased blood in your stool.  You have a change in bowel habits.  You lose weight for no known reason. Summary  Colon polyps are tissue growths inside the colon, which is part of the large intestine.  They are one type of polyp that can grow in the body.  Most colon polyps are noncancerous (benign), but some can become cancerous over time.  This condition is diagnosed with a colonoscopy.  This condition is treated by removing any polyps that are found. Most polyps can be removed during a colonoscopy. This information is not intended to replace advice given to you by your health care provider. Make sure you discuss any questions you have with your health care provider. Document Revised: 02/26/2020 Document Reviewed: 02/26/2020 Elsevier Patient Education  2021 [employer].

## 2021-04-12 LAB — SURGICAL PATHOLOGY

## 2021-04-13 ENCOUNTER — Encounter (INDEPENDENT_AMBULATORY_CARE_PROVIDER_SITE_OTHER): Payer: Self-pay | Admitting: *Deleted

## 2021-04-16 ENCOUNTER — Encounter (HOSPITAL_COMMUNITY): Payer: Self-pay | Admitting: Gastroenterology

## 2021-04-23 ENCOUNTER — Other Ambulatory Visit: Payer: Self-pay | Admitting: Internal Medicine

## 2021-04-23 DIAGNOSIS — Z139 Encounter for screening, unspecified: Secondary | ICD-10-CM

## 2021-04-26 ENCOUNTER — Ambulatory Visit
Admission: RE | Admit: 2021-04-26 | Discharge: 2021-04-26 | Disposition: A | Discharge status: Home / Self Care | Payer: 59 | Source: Ambulatory Visit | Attending: Internal Medicine | Admitting: Internal Medicine

## 2021-04-26 ENCOUNTER — Other Ambulatory Visit: Payer: Self-pay

## 2021-04-26 DIAGNOSIS — Z139 Encounter for screening, unspecified: Secondary | ICD-10-CM

## 2021-04-29 ENCOUNTER — Other Ambulatory Visit: Payer: Self-pay | Admitting: Internal Medicine

## 2021-04-29 DIAGNOSIS — R928 Other abnormal and inconclusive findings on diagnostic imaging of breast: Secondary | ICD-10-CM

## 2021-05-21 ENCOUNTER — Other Ambulatory Visit: Payer: Self-pay | Admitting: Internal Medicine

## 2021-05-21 ENCOUNTER — Ambulatory Visit
Admission: RE | Admit: 2021-05-21 | Discharge: 2021-05-21 | Disposition: A | Discharge status: Home / Self Care | Payer: 59 | Source: Ambulatory Visit | Attending: Internal Medicine | Admitting: Internal Medicine

## 2021-05-21 ENCOUNTER — Other Ambulatory Visit: Payer: Self-pay

## 2021-05-21 DIAGNOSIS — R928 Other abnormal and inconclusive findings on diagnostic imaging of breast: Secondary | ICD-10-CM

## 2021-05-21 DIAGNOSIS — N6320 Unspecified lump in the left breast, unspecified quadrant: Secondary | ICD-10-CM

## 2021-05-31 ENCOUNTER — Other Ambulatory Visit: Payer: 59

## 2021-06-03 ENCOUNTER — Ambulatory Visit
Admission: RE | Admit: 2021-06-03 | Discharge: 2021-06-03 | Disposition: A | Discharge status: Home / Self Care | Payer: 59 | Source: Ambulatory Visit | Attending: Internal Medicine | Admitting: Internal Medicine

## 2021-06-03 ENCOUNTER — Other Ambulatory Visit: Payer: Self-pay

## 2021-06-03 DIAGNOSIS — N6320 Unspecified lump in the left breast, unspecified quadrant: Secondary | ICD-10-CM

## 2021-06-03 HISTORY — PX: BREAST BIOPSY: SHX20
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
## 2021-12-29 ENCOUNTER — Ambulatory Visit: Payer: 59 | Admitting: Orthopaedic Surgery

## 2021-12-29 ENCOUNTER — Encounter: Payer: Self-pay | Admitting: Orthopaedic Surgery

## 2021-12-29 ENCOUNTER — Other Ambulatory Visit: Payer: Self-pay

## 2021-12-29 VITALS — BP 130/74 | Ht 65.0 in | Wt 336.0 lb

## 2021-12-29 DIAGNOSIS — S92502A Displaced unspecified fracture of left lesser toe(s), initial encounter for closed fracture: Secondary | ICD-10-CM

## 2021-12-29 NOTE — Progress Notes (Addendum)
Office Visit Note   Patient: Janice Gillespie           Date of Birth: 12/28/1975           MRN: [id number] Visit Date: 12/29/2021              Requested by: Glenda Chroman, MD [address] [place],  [place] [address] PCP: Glenda Chroman, MD   Assessment & Plan: Visit Diagnoses: left 5th toe fracture  Plan: Patient is a pleasant 46 year old woman who is now 3 weeks status post hitting her left fifth toe when getting out of bed.  She was seen and evaluated and was told she had a fracture of the fifth toe on the left foot.  She has been in a cam walker boot.  She says her toe still feels pretty sore.  She is not diabetic not a smoker.  X-ray showed a fracture at the distal end of the proximal phalanx of the fifth toe does not appear to be intra-articular.  It is a good position.  Discussed with the patient to continue to immobilize this in a boot or a stiff soled shoe for another couple weeks at least.  She understands this will be sore for a while  Follow-Up Instructions: No follow-ups on file.   Orders:  No orders of the defined types were placed in this encounter.  No orders of the defined types were placed in this encounter.     Procedures: No procedures performed   Clinical Data: No additional findings.   Subjective: Chief Complaint  Patient presents with   Left Foot - Injury    DOI 12/07/2021  Patient presents today for left foot pain. She hit her left foot on a bed and injured her fifth toe on 12/07/2021. She has had prior x-rays done. She is currently in a boot and taking over the counter medicine.     Review of Systems  All other systems reviewed and are negative.   Objective: Vital Signs: There were no vitals taken for this visit.  Physical Exam Constitutional:      Appearance: Normal appearance.  Pulmonary:     Effort: Pulmonary effort is normal.  Skin:    General: Skin is warm and dry.  Neurological:     General: No focal deficit present.     Mental  Status: She is alert and oriented to person, place, and time.    Ortho Exam Examination of her left foot her foot is warm brisk capillary refill.  Pulses are palpable she has some swelling of the fifth toe but no deformity compartments are soft.  No erythema sensation is intact tender to palpation over the fifth toe Specialty Comments:  No specialty comments available.  Imaging: No results found.   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    Trichomonal vaginitis 10/28/2014   Morbid obesity with BMI of 50.0-59.9, adult ([facility]) 10/23/2014   Past Medical History:  Diagnosis Date   Arthritis    Pre-diabetes    Sleep apnea     Family History  Problem Relation Age of Onset   Diabetes Father    Heart disease Father    Diabetes Maternal Grandmother    Cancer Maternal Grandfather    Mental illness Paternal Grandmother        dementia   Breast cancer Neg Hx     Past Surgical History:  Procedure Laterality Date   cataract surgery Bilateral    CESAREAN SECTION     COLONOSCOPY WITH PROPOFOL N/A 04/09/2021   Procedure: COLONOSCOPY WITH PROPOFOL;  Surgeon: Harvel Quale, MD;  Location: [facility];  Service: Gastroenterology;  Laterality: N/A;  1200   CYST REMOVAL NECK     FOREIGN BODY REMOVAL Right 05/05/2014   Procedure: FOREIGN BODY REMOVAL ADULT LEG;  Surgeon: Jamesetta So, MD;  Location: [facility];  Service: General;  Laterality: Right;   KNEE ARTHROSCOPY WITH MEDIAL MENISECTOMY Right 06/29/2017   Procedure: KNEE ARTHROSCOPY WITH PARTICAL MEDIAL MENISECTOMY;  Surgeon: Carole Civil, MD;  Location: [facility];  Service: Orthopedics;  Laterality: Right;   POLYPECTOMY  04/09/2021   Procedure: POLYPECTOMY;  Surgeon: Harvel Quale, MD;  Location: [facility];  Service: Gastroenterology;;   TUBAL LIGATION     Social History   Occupational History   Not on file  Tobacco  Use   Smoking status: Every Day    Packs/day: 0.25    Years: 25.00    Pack years: 6.25    Types: Cigarettes   Smokeless tobacco: Never  Vaping Use   Vaping Use: Never used  Substance and Sexual Activity   Alcohol use: No   Drug use: No   Sexual activity: Yes    Birth control/protection: Surgical

## 2022-02-03 ENCOUNTER — Other Ambulatory Visit: Payer: Self-pay | Admitting: Internal Medicine

## 2022-05-04 ENCOUNTER — Ambulatory Visit
Admission: RE | Admit: 2022-05-04 | Discharge: 2022-05-04 | Disposition: A | Discharge status: Home / Self Care | Payer: 59 | Source: Ambulatory Visit | Attending: Internal Medicine | Admitting: Internal Medicine

## 2022-05-04 DIAGNOSIS — Z1231 Encounter for screening mammogram for malignant neoplasm of breast: Secondary | ICD-10-CM

## 2022-05-04 HISTORY — DX: Nipple discharge: N64.52

## 2023-09-25 ENCOUNTER — Other Ambulatory Visit: Payer: Self-pay | Admitting: Internal Medicine

## 2023-09-25 DIAGNOSIS — Z1231 Encounter for screening mammogram for malignant neoplasm of breast: Secondary | ICD-10-CM

## 2023-10-03 ENCOUNTER — Inpatient Hospital Stay: Admission: RE | Admit: 2023-10-03 | Payer: 59 | Source: Ambulatory Visit

## 2023-10-25 ENCOUNTER — Ambulatory Visit
Admission: RE | Admit: 2023-10-25 | Discharge: 2023-10-25 | Disposition: A | Discharge status: Home / Self Care | Payer: 59 | Source: Ambulatory Visit | Attending: Family Medicine | Admitting: Family Medicine

## 2023-10-25 ENCOUNTER — Other Ambulatory Visit: Payer: Self-pay | Admitting: Family Medicine

## 2023-10-25 DIAGNOSIS — Z1231 Encounter for screening mammogram for malignant neoplasm of breast: Secondary | ICD-10-CM
